# Patient Record
Sex: Female | Born: 1985
Health system: Southern US, Community
[De-identification: ages and names within clinical notes are randomized; demographics above are authoritative.]

## PROBLEM LIST (undated history)

## (undated) DIAGNOSIS — J45909 Unspecified asthma, uncomplicated: Secondary | ICD-10-CM

## (undated) DIAGNOSIS — E559 Vitamin D deficiency, unspecified: Secondary | ICD-10-CM

## (undated) HISTORY — DX: Unspecified asthma, uncomplicated: J45.909

## (undated) HISTORY — DX: Vitamin D deficiency, unspecified: E55.9

## (undated) HISTORY — PX: NO PAST SURGERIES: SHX2092

---
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
## 2004-01-16 ENCOUNTER — Encounter: Admission: RE | Admit: 2004-01-16 | Discharge: 2004-02-25 | Payer: Self-pay | Admitting: Obstetrics & Gynecology

## 2004-11-21 ENCOUNTER — Emergency Department: Payer: Self-pay | Admitting: Unknown Physician Specialty

## 2005-10-12 ENCOUNTER — Ambulatory Visit (HOSPITAL_COMMUNITY): Admission: RE | Admit: 2005-10-12 | Discharge: 2005-10-12 | Payer: Self-pay | Admitting: Obstetrics & Gynecology

## 2008-04-23 ENCOUNTER — Ambulatory Visit (HOSPITAL_COMMUNITY): Admission: RE | Admit: 2008-04-23 | Discharge: 2008-04-23 | Payer: Self-pay | Admitting: Obstetrics & Gynecology

## 2008-07-16 ENCOUNTER — Ambulatory Visit (HOSPITAL_COMMUNITY): Admission: RE | Admit: 2008-07-16 | Discharge: 2008-07-16 | Payer: Self-pay | Admitting: Obstetrics & Gynecology

## 2008-09-18 ENCOUNTER — Inpatient Hospital Stay (HOSPITAL_COMMUNITY): Admission: AD | Admit: 2008-09-18 | Discharge: 2008-09-20 | Payer: Self-pay | Admitting: Obstetrics & Gynecology

## 2009-11-05 ENCOUNTER — Ambulatory Visit (HOSPITAL_COMMUNITY): Admission: RE | Admit: 2009-11-05 | Discharge: 2009-11-05 | Payer: Self-pay | Admitting: Obstetrics

## 2009-12-09 ENCOUNTER — Ambulatory Visit (HOSPITAL_COMMUNITY): Admission: RE | Admit: 2009-12-09 | Discharge: 2009-12-09 | Payer: Self-pay | Admitting: Obstetrics & Gynecology

## 2010-04-22 ENCOUNTER — Inpatient Hospital Stay (HOSPITAL_COMMUNITY)
Admission: AD | Admit: 2010-04-22 | Discharge: 2010-04-23 | Payer: Self-pay | Source: Home / Self Care | Admitting: Obstetrics & Gynecology

## 2010-06-15 ENCOUNTER — Encounter: Payer: Self-pay | Admitting: Obstetrics & Gynecology

## 2010-08-05 LAB — CBC
HCT: 28.6 % — ABNORMAL LOW (ref 36.0–46.0)
Hemoglobin: 9.3 g/dL — ABNORMAL LOW (ref 12.0–15.0)
MCH: 23.6 pg — ABNORMAL LOW (ref 26.0–34.0)
MCH: 25.3 pg — ABNORMAL LOW (ref 26.0–34.0)
MCHC: 32.4 g/dL (ref 30.0–36.0)
MCV: 76.7 fL — ABNORMAL LOW (ref 78.0–100.0)
RBC: 3.93 MIL/uL (ref 3.87–5.11)
RDW: 17.5 % — ABNORMAL HIGH (ref 11.5–15.5)
RDW: 17.6 % — ABNORMAL HIGH (ref 11.5–15.5)
WBC: 11.4 10*3/uL — ABNORMAL HIGH (ref 4.0–10.5)

## 2010-09-03 LAB — CBC
Hemoglobin: 8.8 g/dL — ABNORMAL LOW (ref 12.0–15.0)
MCHC: 34.1 g/dL (ref 30.0–36.0)
MCV: 83.8 fL (ref 78.0–100.0)
Platelets: 192 10*3/uL (ref 150–400)
RBC: 3.8 MIL/uL — ABNORMAL LOW (ref 3.87–5.11)
RDW: 15.2 % (ref 11.5–15.5)
WBC: 11.2 10*3/uL — ABNORMAL HIGH (ref 4.0–10.5)
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
## 2010-10-10 NOTE — Discharge Summary (Signed)
[facility]  Patient:    ERINE, PHENIX                       MRN: [id number] Adm. Date:  [id number] Disc. Date: [id number] Attending:  Milford Cage H                           Discharge Summary  PATIENT IDENTIFICATION:  Purvi was a 25 year old female.  HISTORY OF PRESENT ILLNESS:  Kynlie was admitted to this service by Dr. Marily Lente.  She had been suicidal and depressed.  She stated that she had heard voices telling her to harm herself.  She had become increasingly angry and distraught because she was having to baby-sit for her siblings.  She had begun to experience loss of energy, loss of interest, trouble concentrating, feelings of hopelessness and worthlessness.  She also had trouble sleeping at night.  MENTAL STATUS EXAMINATION:  At the time of the initial evaluation, revealed an alert, oriented young woman who made intermittent eye contact.  Her mood was depressed.  She was positive for suicidal ideation.  There was a history of auditory hallucinations.  There was no other evidence of any psychotic thinking or behavior.  She seemed to be, at least, average intelligence. Concentration was diminished.  Insight was minimal and judgment seemed poor. Other pertinent history can be obtained from the psychosocial service summary. Physical examination was noncontributory.  ADMISSION DIAGNOSES: Axis I:    1. Mood disorder, not otherwise specified.            2. Psychotic disorder, not otherwise specified. Axis II:   Deferred. Axis III:  Healthy. Axis IV:   Severe. Axis V:    15.  FINDINGS:  All indicated laboratory examinations were within normal limits or noncontributory.  HOSPITAL COURSE:  While in the hospital, Shatiqua talked about her issues with her temper.  She admitted that she was short-tempered, and, to some extent, needed to be the center of attention.  She also admitted that she had to be right.  She just could not tolerate it when  someone else disagreed with her. She would keep talking and trying to convince them of her viewpoint.  She also was smart enough to realize that these were her problems and she created her own problems to some extent by the attitude she had, and she seemed perfectly willing to change the attitude.  Her mother came in for a session and they worked things out regarding the rules and expectations of home, and she was discharged.  At the time of discharge, she was denying any threats towards herself or anyone else.  She was excited about going home and believed that things would change for the better.  She also had denied any auditory hallucinations while she was in the hospital.  POST HOSPITAL CARE PLAN:  She was referred to Cheri Fowler, with an appointment for May 4, and Dr. Elesa Massed, with an appointment for June 6, both at the [facility].  At the time of discharge, she was taking Celexa 10 mg at bedtime by mouth, Zyprexa 5 mg at bedtime.  There were no restrictions placed on her activity or her diet.  FINAL DIAGNOSES: Axis I:    Mood disorder, not otherwise specified. Axis II:   No diagnosis. Axis III:  1. Recent history of ulcers.            2.  Allergic rash on the right wrist. Axis IV:   Severe. Axis V:    60. DD:  10/01/99 TD:  10/04/99 Job: [id number]

## 2011-02-09 ENCOUNTER — Other Ambulatory Visit: Payer: Self-pay | Admitting: Internal Medicine

## 2011-02-09 DIAGNOSIS — E049 Nontoxic goiter, unspecified: Secondary | ICD-10-CM

## 2011-02-17 ENCOUNTER — Encounter (HOSPITAL_COMMUNITY)
Admission: RE | Admit: 2011-02-17 | Discharge: 2011-02-17 | Disposition: A | Discharge status: Home / Self Care | Payer: Medicaid Other | Source: Ambulatory Visit | Attending: Internal Medicine | Admitting: Internal Medicine

## 2011-02-17 DIAGNOSIS — E049 Nontoxic goiter, unspecified: Secondary | ICD-10-CM | POA: Insufficient documentation

## 2011-02-18 ENCOUNTER — Ambulatory Visit (HOSPITAL_COMMUNITY)
Admission: RE | Admit: 2011-02-18 | Discharge: 2011-02-18 | Disposition: A | Discharge status: Home / Self Care | Payer: Medicaid Other | Source: Ambulatory Visit | Attending: Internal Medicine | Admitting: Internal Medicine

## 2011-02-18 DIAGNOSIS — E049 Nontoxic goiter, unspecified: Secondary | ICD-10-CM | POA: Insufficient documentation

## 2011-02-18 MED ORDER — SODIUM IODIDE I 131 CAPSULE
9.3000 | Freq: Once | INTRAVENOUS | Status: AC | PRN
  Administered 2011-02-18: 9.3 via ORAL

## 2011-12-14 ENCOUNTER — Ambulatory Visit (HOSPITAL_BASED_OUTPATIENT_CLINIC_OR_DEPARTMENT_OTHER): Payer: Medicaid Other

## 2013-06-26 ENCOUNTER — Encounter: Payer: Self-pay | Admitting: Obstetrics & Gynecology

## 2013-06-26 ENCOUNTER — Ambulatory Visit: Payer: Self-pay | Admitting: Obstetrics & Gynecology

## 2013-06-26 ENCOUNTER — Ambulatory Visit (INDEPENDENT_AMBULATORY_CARE_PROVIDER_SITE_OTHER): Payer: Medicare Other | Admitting: Obstetrics & Gynecology

## 2013-06-26 VITALS — BP 125/84 | HR 112 | Temp 96.9°F | Wt 248.0 lb

## 2013-06-26 DIAGNOSIS — Z3046 Encounter for surveillance of implantable subdermal contraceptive: Secondary | ICD-10-CM

## 2013-06-26 DIAGNOSIS — R634 Abnormal weight loss: Secondary | ICD-10-CM

## 2013-06-26 DIAGNOSIS — Z30017 Encounter for initial prescription of implantable subdermal contraceptive: Secondary | ICD-10-CM

## 2013-06-26 DIAGNOSIS — E669 Obesity, unspecified: Secondary | ICD-10-CM

## 2013-06-26 MED ORDER — PHENTERMINE HCL 37.5 MG PO CAPS: 37.5000 mg | ORAL_CAPSULE | ORAL | Status: DC

## 2013-06-26 NOTE — Progress Notes (Signed)
NEXPLANON REMOVAL/NEXPLANON INSERTION NOTE  Contraception used: *Nexplanon  Pregnancy test result:  No results found for this basename: PREGTESTUR, PREGSERUM, HCG, HCGQUANT    Indications:  The patient desires contraception.  She understands risks, benefits, and alternatives to Implanon and would like to proceed.   Reasons  for removal:  Time for removal   A timeout was performed confirming the patient, the procedure and allergy status. The patient's left  arm was palpated and the implant device located. The area was prepped with Betadinex3. The distal end of the device was palpated and 5 cc of 1% lidocaine was injected. A 5 mm incision was made. Any fibrotic tissue was carefully dissected away using blunt and/or sharp dissection. The device was removed in an intact manner. The protection cap was removed. While placing countertraction on the skin, the needle was inserted at a 30 degree angle.  The applicator was held horizontal to the skin; the skin was tented upward as the needle was introduced into the subdermal space.  While holding the applicator in place, the slider was unlocked. The Nexplanon was removed from the field.  The Nexplanon was palpated to ensure proper placement.  Steri-strips and a sterile dressing were applied to the incision. The patient tolerated the procedure well.  Complications: None  Instructions:  The patient was instructed to remove the dressing in 24 hours and that some bruising is to be expected.  She was advised to use over the counter analgesics as needed for any pain at the site.  She is to keep the area dry for 24 hours and to call if her hand or arm becomes cold, numb, or blue.  The patient requested phentermine for weight loss today.  Counseled ZO:XWRUEAVWUre:lifestyle modifications Return visit:  Return in 6+ weeks

## 2013-06-28 DIAGNOSIS — E669 Obesity, unspecified: Secondary | ICD-10-CM | POA: Insufficient documentation

## 2013-06-28 NOTE — Patient Instructions (Addendum)
Etonogestrel implant What is this medicine? ETONOGESTREL (et oh noe JES trel) is a contraceptive (birth control) device. It is used to prevent pregnancy. It can be used for up to 3 years. This medicine may be used for other purposes; ask your health care provider or pharmacist if you have questions. COMMON BRAND NAME(S): Implanon, Nexplanon  What should I tell my health care provider before I take this medicine? They need to know if you have any of these conditions: -abnormal vaginal bleeding -blood vessel disease or blood clots -cancer of the breast, cervix, or liver -depression -diabetes -gallbladder disease -headaches -heart disease or recent heart attack -high blood pressure -high cholesterol -kidney disease -liver disease -renal disease -seizures -tobacco smoker -an unusual or allergic reaction to etonogestrel, other hormones, anesthetics or antiseptics, medicines, foods, dyes, or preservatives -pregnant or trying to get pregnant -breast-feeding How should I use this medicine? This device is inserted just under the skin on the inner side of your upper arm by a health care professional. Talk to your pediatrician regarding the use of this medicine in children. Special care may be needed. Overdosage: If you think you've taken too much of this medicine contact a poison control center or emergency room at once. Overdosage: If you think you have taken too much of this medicine contact a poison control center or emergency room at once. NOTE: This medicine is only for you. Do not share this medicine with others. What if I miss a dose? This does not apply. What may interact with this medicine? Do not take this medicine with any of the following medications: -amprenavir -bosentan -fosamprenavir This medicine may also interact with the following medications: -barbiturate medicines for inducing sleep or treating seizures -certain medicines for fungal infections like ketoconazole and  itraconazole -griseofulvin -medicines to treat seizures like carbamazepine, felbamate, oxcarbazepine, phenytoin, topiramate -modafinil -phenylbutazone -rifampin -some medicines to treat HIV infection like atazanavir, indinavir, lopinavir, nelfinavir, tipranavir, ritonavir -St. John's wort This list may not describe all possible interactions. Give your health care provider a list of all the medicines, herbs, non-prescription drugs, or dietary supplements you use. Also tell them if you smoke, drink alcohol, or use illegal drugs. Some items may interact with your medicine. What should I watch for while using this medicine? This product does not protect you against HIV infection (AIDS) or other sexually transmitted diseases. You should be able to feel the implant by pressing your fingertips over the skin where it was inserted. Tell your doctor if you cannot feel the implant. What side effects may I notice from receiving this medicine? Side effects that you should report to your doctor or health care professional as soon as possible: -allergic reactions like skin rash, itching or hives, swelling of the face, lips, or tongue -breast lumps -changes in vision -confusion, trouble speaking or understanding -dark urine -depressed mood -general ill feeling or flu-like symptoms -light-colored stools -loss of appetite, nausea -right upper belly pain -severe headaches -severe pain, swelling, or tenderness in the abdomen -shortness of breath, chest pain, swelling in a leg -signs of pregnancy -sudden numbness or weakness of the face, arm or leg -trouble walking, dizziness, loss of balance or coordination -unusual vaginal bleeding, discharge -unusually weak or tired -yellowing of the eyes or skin Side effects that usually do not require medical attention (Report these to your doctor or health care professional if they continue or are bothersome.): -acne -breast pain -changes in  weight -cough -fever or chills -headache -irregular menstrual bleeding -itching, burning,   and vaginal discharge -pain or difficulty passing urine -sore throat This list may not describe all possible side effects. Call your doctor for medical advice about side effects. You may report side effects to FDA at 1-800-FDA-1088. Where should I keep my medicine? This drug is given in a hospital or clinic and will not be stored at home. NOTE: This sheet is a summary. It may not cover all possible information. If you have questions about this medicine, talk to your doctor, pharmacist, or health care provider.  2014, Elsevier/Gold Standard. (2011-11-16 15:37:45) Exercise to Lose Weight Exercise and a healthy diet may help you lose weight. Your doctor may suggest specific exercises. EXERCISE IDEAS AND TIPS  Choose low-cost things you enjoy doing, such as walking, bicycling, or exercising to workout videos.  Take stairs instead of the elevator.  Walk during your lunch break.  Park your car further away from work or school.  Go to a gym or an exercise class.  Start with 5 to 10 minutes of exercise each day. Build up to 30 minutes of exercise 4 to 6 days a week.  Wear shoes with good support and comfortable clothes.  Stretch before and after working out.  Work out until you breathe harder and your heart beats faster.  Drink extra water when you exercise.  Do not do so much that you hurt yourself, feel dizzy, or get very short of breath. Exercises that burn about 150 calories:  Running 1  miles in 15 minutes.  Playing volleyball for 45 to 60 minutes.  Washing and waxing a car for 45 to 60 minutes.  Playing touch football for 45 minutes.  Walking 1  miles in 35 minutes.  Pushing a stroller 1  miles in 30 minutes.  Playing basketball for 30 minutes.  Raking leaves for 30 minutes.  Bicycling 5 miles in 30 minutes.  Walking 2 miles in 30 minutes.  Dancing for 30  minutes.  Shoveling snow for 15 minutes.  Swimming laps for 20 minutes.  Walking up stairs for 15 minutes.  Bicycling 4 miles in 15 minutes.  Gardening for 30 to 45 minutes.  Jumping rope for 15 minutes.  Washing windows or floors for 45 to 60 minutes. Document Released: 06/13/2010 Document Revised: 08/03/2011 Document Reviewed: 06/13/2010 [facility] Patient Information 2014 [place], [place]. Calorie Counting Diet A calorie counting diet requires you to eat the number of calories that are right for you in a day. Calories are the measurement of how much energy you get from the food you eat. Eating the right amount of calories is important for staying at a healthy weight. If you eat too many calories, your body will store them as fat and you may gain weight. If you eat too few calories, you may lose weight. Counting the number of calories you eat during a day will help you know if you are eating the right amount. A Registered Dietitian can determine how many calories you need in a day. The amount of calories needed varies from person to person. If your goal is to lose weight, you will need to eat fewer calories. Losing weight can benefit you if you are overweight or have health problems such as heart disease, high blood pressure, or diabetes. If your goal is to gain weight, you will need to eat more calories. Gaining weight may be necessary if you have a certain health problem that causes your body to need more energy. TIPS Whether you are increasing or decreasing the number  of calories you eat during a day, it may be hard to get used to changes in what you eat and drink. The following are tips to help you keep track of the number of calories you eat.  Measure foods at home with measuring cups. This helps you know the amount of food and number of calories you are eating.  Restaurants often serve food in amounts that are larger than 1 serving. While eating out, estimate how many servings of a food  you are given. For example, a serving of cooked rice is  cup or about the size of half of a fist. Knowing serving sizes will help you be aware of how much food you are eating at restaurants.  Ask for smaller portion sizes or child-size portions at restaurants.  Plan to eat half of a meal at a restaurant. Take the rest home or share the other half with a friend.  Read the Nutrition Facts panel on food labels for calorie content and serving size. You can find out how many servings are in a package, the size of a serving, and the number of calories each serving has.  For example, a package might contain 3 cookies. The Nutrition Facts panel on that package says that 1 serving is 1 cookie. Below that, it will say there are 3 servings in the container. The calories section of the Nutrition Facts label says there are 90 calories. This means there are 90 calories in 1 cookie (1 serving). If you eat 1 cookie you have eaten 90 calories. If you eat all 3 cookies, you have eaten 270 calories (3 servings x 90 calories = 270 calories). The list below tells you how big or small some common portion sizes are.  1 oz.........4 stacked dice.  3 oz........Marland KitchenDeck of cards.  1 tsp.......Marland KitchenTip of little finger.  1 tbs......Marland KitchenMarland KitchenThumb.  2 tbs.......Marland KitchenGolf ball.   cup......Marland KitchenHalf of a fist.  1 cup.......Marland KitchenA fist. KEEP A FOOD LOG Write down every food item you eat, the amount you eat, and the number of calories in each food you eat during the day. At the end of the day, you can add up the total number of calories you have eaten. It may help to keep a list like the one below. Find out the calorie information by reading the Nutrition Facts panel on food labels. Breakfast  Bran cereal (1 cup, 110 calories).  Fat-free milk ( cup, 45 calories). Snack  Apple (1 medium, 80 calories). Lunch  Spinach (1 cup, 20 calories).  Tomato ( medium, 20 calories).  Chicken breast strips (3 oz, 165 calories).  Shredded  cheddar cheese ( cup, 110 calories).  Light [place] dressing (2 tbs, 60 calories).  Whole-wheat bread (1 slice, 80 calories).  Tub margarine (1 tsp, 35 calories).  Vegetable soup (1 cup, 160 calories). Dinner  Pork chop (3 oz, 190 calories).  Brown rice (1 cup, 215 calories).  Steamed broccoli ( cup, 20 calories).  Strawberries (1  cup, 65 calories).  Whipped cream (1 tbs, 50 calories). Daily Calorie Total: 1425 Document Released: 05/11/2005 Document Revised: 08/03/2011 Document Reviewed: 11/05/2006 [facility] Patient Information 2014 [place], [place].

## 2013-06-30 ENCOUNTER — Ambulatory Visit (INDEPENDENT_AMBULATORY_CARE_PROVIDER_SITE_OTHER): Payer: Medicare Other | Admitting: Obstetrics & Gynecology

## 2013-06-30 ENCOUNTER — Encounter: Payer: Self-pay | Admitting: Obstetrics & Gynecology

## 2013-06-30 VITALS — BP 120/86 | HR 116 | Temp 98.4°F | Ht 64.0 in | Wt 241.0 lb

## 2013-06-30 DIAGNOSIS — Z3046 Encounter for surveillance of implantable subdermal contraceptive: Secondary | ICD-10-CM

## 2013-06-30 LAB — CK: CK TOTAL: 278 U/L — AB (ref 7–177)

## 2013-06-30 LAB — HEMOGLOBIN: Hemoglobin: 14 g/dL (ref 12.0–15.0)

## 2013-06-30 LAB — HEMATOCRIT: HEMATOCRIT: 40.5 % (ref 36.0–46.0)

## 2013-06-30 NOTE — Progress Notes (Signed)
Subjective:     Michelle Freeman is a 28 y.o. female here for a routine exam.  Current complaints: Patient in office today for nexplanon complications. Patient states insertion site is very bruised. Patient states she is having pain all the way down her arm. Patient states it is like a pushing pain on her veins. Patient states she feels like she is going to pass out.  Personal health questionnaire reviewed: yes.   Gynecologic History No LMP recorded. Patient has had an implant. Contraception: Nexplanon  Obstetric History OB History  Gravida Para Term Preterm AB SAB TAB Ectopic Multiple Living  4 2 2  2     2     # Outcome Date GA Lbr Len/2nd Weight Sex Delivery Anes PTL Lv  4 TRM 04/22/10 3721w0d  6 lb 2 oz (2.778 kg)     Y  3 ABT 02/2009        N  2 TRM 09/19/08 1121w0d  6 lb 12 oz (3.062 kg) M    Y  1 ABT 10/2000        N       The following portions of the patient's history were reviewed and updated as appropriate: allergies, current medications, past family history, past medical history, past social history, past surgical history and problem list.  Review of Systems Pertinent items are noted in HPI.    Objective:     LUE: ecchymotic area; no palpable mass; implant palpated     Assessment:    Bruising at Nexplanon insertion site  Plan:    Supportive measures  Return in 1 week

## 2013-07-01 LAB — TSH: TSH: 2.013 u[IU]/mL (ref 0.350–4.500)

## 2013-07-01 LAB — T4, FREE: FREE T4: 1.45 ng/dL (ref 0.80–1.80)

## 2013-07-05 ENCOUNTER — Ambulatory Visit: Payer: Medicaid Other | Admitting: Obstetrics & Gynecology

## 2013-08-16 ENCOUNTER — Ambulatory Visit: Payer: Medicaid Other | Admitting: Obstetrics & Gynecology

## 2013-08-17 ENCOUNTER — Encounter: Payer: Self-pay | Admitting: Obstetrics & Gynecology

## 2013-08-17 ENCOUNTER — Ambulatory Visit: Payer: Medicaid Other | Admitting: Obstetrics & Gynecology

## 2013-08-17 ENCOUNTER — Ambulatory Visit (INDEPENDENT_AMBULATORY_CARE_PROVIDER_SITE_OTHER): Payer: Medicare Other | Admitting: Obstetrics & Gynecology

## 2013-08-17 VITALS — BP 117/82 | HR 106 | Temp 97.6°F | Wt 238.0 lb

## 2013-08-17 DIAGNOSIS — Z0000 Encounter for general adult medical examination without abnormal findings: Secondary | ICD-10-CM

## 2013-08-17 DIAGNOSIS — D649 Anemia, unspecified: Secondary | ICD-10-CM

## 2013-08-17 DIAGNOSIS — Z01419 Encounter for gynecological examination (general) (routine) without abnormal findings: Secondary | ICD-10-CM

## 2013-08-17 DIAGNOSIS — Z124 Encounter for screening for malignant neoplasm of cervix: Secondary | ICD-10-CM

## 2013-08-17 DIAGNOSIS — Z113 Encounter for screening for infections with a predominantly sexual mode of transmission: Secondary | ICD-10-CM

## 2013-08-17 DIAGNOSIS — D509 Iron deficiency anemia, unspecified: Secondary | ICD-10-CM

## 2013-08-17 DIAGNOSIS — Z131 Encounter for screening for diabetes mellitus: Secondary | ICD-10-CM

## 2013-08-17 NOTE — Progress Notes (Signed)
Subjective:     Michelle Freeman is a 28 y.o. female here for a routine exam.  Current complaints: annual exam.  Patient states she is having pain and tenderness in her breast. Pt states her breasts now have black spots. Pt states she is having a burning pain in her stomach. Patient states she is having pain in her back.   Personal health questionnaire reviewed: yes.   Gynecologic History No LMP recorded. Patient has had an implant. Contraception: Nexplanon Last Pap: 10/2009. Results were: normal  Obstetric History OB History  Gravida Para Term Preterm AB SAB TAB Ectopic Multiple Living  4 2 2  2     2     # Outcome Date GA Lbr Len/2nd Weight Sex Delivery Anes PTL Lv  4 TRM 04/22/10 3737w0d  6 lb 2 oz (2.778 kg)     Y  3 ABT 02/2009        N  2 TRM 09/19/08 8137w0d  6 lb 12 oz (3.062 kg) M    Y  1 ABT 10/2000        N       The following portions of the patient's history were reviewed and updated as appropriate: allergies, current medications, past family history, past medical history, past social history, past surgical history and problem list.  Review of Systems Pertinent items are noted in HPI.    Objective:      General appearance: alert Breasts: normal appearance, no masses or tenderness Abdomen: soft, non-tender; bowel sounds normal; no masses,  no organomegaly Pelvic: cervix normal in appearance, external genitalia normal, no adnexal masses or tenderness, uterus normal size, shape, and consistency and vagina normal without discharge      Ext:  LUE--implant palpable  Assessment:   Breast pain--large breasts Obesity on pharmacologic treatment w/weight loss documented today Plan:    Counseled ZO:[id number]:supportive management of breast pain Continue Phentermine--monthly weight/vital sign check

## 2013-08-18 LAB — PAP IG, CT-NG, RFX HPV ASCU
CHLAMYDIA PROBE AMP: NEGATIVE
GC PROBE AMP: NEGATIVE

## 2013-08-18 LAB — HEMOGLOBIN A1C
Hgb A1c MFr Bld: 5.8 % — ABNORMAL HIGH (ref <5.7)
MEAN PLASMA GLUCOSE: 120 mg/dL — AB (ref <117)

## 2013-08-18 LAB — HIV ANTIBODY (ROUTINE TESTING W REFLEX): HIV: NONREACTIVE

## 2013-08-18 LAB — HEMOGLOBIN: Hemoglobin: 14.2 g/dL (ref 12.0–15.0)

## 2013-08-18 LAB — RPR

## 2013-08-18 LAB — HEMATOCRIT: HCT: 41.8 % (ref 36.0–46.0)

## 2013-08-20 DIAGNOSIS — Z01419 Encounter for gynecological examination (general) (routine) without abnormal findings: Secondary | ICD-10-CM | POA: Insufficient documentation

## 2013-08-20 NOTE — Patient Instructions (Signed)

## 2013-09-11 ENCOUNTER — Ambulatory Visit (INDEPENDENT_AMBULATORY_CARE_PROVIDER_SITE_OTHER): Payer: Medicare Other | Admitting: *Deleted

## 2013-09-11 VITALS — BP 115/76 | HR 120 | Temp 98.4°F | Ht 64.0 in | Wt 240.0 lb

## 2013-09-11 DIAGNOSIS — E669 Obesity, unspecified: Secondary | ICD-10-CM

## 2013-09-11 NOTE — Progress Notes (Addendum)
Patient states she has had a burning sensation in her chest lately with her bra on. Patient states she has pain in her chest first thing in the morning and gets short of breath easily.  Patient states she will take a deep breath and cant exhale to inhale again. Patient states when she finally exhale she feels like she is strangled. Patient states the breathing is only occasionally but that the chest pain has been happening everyday for the past week. Patient states she has started using her Albuterol Inhaler again but that it hasn't really helped. Patient states she does not take blood pressure medications. Blood Pressure was 115/76 Today. Patient advised about diet and exercise, handouts given. Patient notified of lab results and advised to follow up with her primary care physician regarding lab results, Chest pain and symptoms. Copy of lab results given to patient.

## 2013-12-11 ENCOUNTER — Ambulatory Visit: Payer: Medicare Other | Admitting: *Deleted

## 2013-12-11 VITALS — BP 121/81 | HR 105 | Ht 65.0 in | Wt 240.0 lb

## 2013-12-11 DIAGNOSIS — R634 Abnormal weight loss: Secondary | ICD-10-CM

## 2013-12-11 MED ORDER — PHENTERMINE HCL 37.5 MG PO CAPS: 37.5000 mg | ORAL_CAPSULE | ORAL | Status: DC

## 2013-12-12 NOTE — Progress Notes (Signed)
Patient had multiple complaints and they were briefly addressed by the provider. Patient is to seek a new primary care provider and work on her activity level- getting out of her house and doing more for herself. Patient to follow up  In a month with progress.

## 2014-02-15 ENCOUNTER — Ambulatory Visit: Payer: Medicare Other | Admitting: Obstetrics & Gynecology

## 2014-03-09 ENCOUNTER — Other Ambulatory Visit: Payer: Self-pay

## 2014-05-21 ENCOUNTER — Encounter: Payer: Self-pay | Admitting: *Deleted

## 2014-05-22 ENCOUNTER — Encounter: Payer: Self-pay | Admitting: Obstetrics & Gynecology

## 2014-08-17 ENCOUNTER — Emergency Department: Payer: Self-pay | Admitting: Emergency Medicine

## 2014-08-17 LAB — BASIC METABOLIC PANEL
Anion Gap: 8 (ref 7–16)
BUN: 8 mg/dL
CALCIUM: 9.2 mg/dL
CHLORIDE: 108 mmol/L
Co2: 23 mmol/L
Creatinine: 0.65 mg/dL
EGFR (African American): >60
Glucose: 110 mg/dL — ABNORMAL HIGH
POTASSIUM: 3.8 mmol/L
Sodium: 139 mmol/L

## 2014-08-17 LAB — CBC
HCT: 41 % (ref 35.0–47.0)
HGB: 13.6 g/dL (ref 12.0–16.0)
MCH: 28.2 pg (ref 26.0–34.0)
MCHC: 33.1 g/dL (ref 32.0–36.0)
MCV: 85 fL (ref 80–100)
PLATELETS: 217 10*3/uL (ref 150–440)
RBC: 4.8 10*6/uL (ref 3.80–5.20)
RDW: 13.2 % (ref 11.5–14.5)
WBC: 11.2 10*3/uL — AB (ref 3.6–11.0)

## 2014-08-17 LAB — TROPONIN I: Troponin-I: <0.03 ng/mL

## 2014-08-18 LAB — PREGNANCY, URINE: Pregnancy Test, Urine: NEGATIVE m[IU]/mL

## 2015-01-02 ENCOUNTER — Ambulatory Visit (HOSPITAL_BASED_OUTPATIENT_CLINIC_OR_DEPARTMENT_OTHER): Payer: Medicare Other | Attending: Internal Medicine | Admitting: *Deleted

## 2015-01-02 VITALS — Ht 64.0 in | Wt 241.0 lb

## 2015-01-02 DIAGNOSIS — G478 Other sleep disorders: Secondary | ICD-10-CM | POA: Insufficient documentation

## 2015-01-02 DIAGNOSIS — G4750 Parasomnia, unspecified: Secondary | ICD-10-CM | POA: Insufficient documentation

## 2015-01-02 DIAGNOSIS — F513 Sleepwalking [somnambulism]: Secondary | ICD-10-CM | POA: Diagnosis not present

## 2015-01-02 DIAGNOSIS — R5383 Other fatigue: Secondary | ICD-10-CM | POA: Insufficient documentation

## 2015-01-02 DIAGNOSIS — R0683 Snoring: Secondary | ICD-10-CM | POA: Insufficient documentation

## 2015-01-02 DIAGNOSIS — Z8669 Personal history of other diseases of the nervous system and sense organs: Secondary | ICD-10-CM

## 2015-01-02 DIAGNOSIS — R51 Headache: Secondary | ICD-10-CM | POA: Insufficient documentation

## 2015-01-05 ENCOUNTER — Ambulatory Visit (HOSPITAL_BASED_OUTPATIENT_CLINIC_OR_DEPARTMENT_OTHER): Payer: Medicaid Other | Admitting: Internal Medicine

## 2015-01-05 DIAGNOSIS — Z8669 Personal history of other diseases of the nervous system and sense organs: Secondary | ICD-10-CM

## 2015-01-05 DIAGNOSIS — R0683 Snoring: Secondary | ICD-10-CM | POA: Diagnosis not present

## 2015-01-05 NOTE — Progress Notes (Signed)
   Patient Name: Michelle Freeman, Hamil Date: 01/02/2015 Gender: Female D.O.B: November 26, 1985 Age (years): 29 Referring Provider: Fleet Contras Height (inches): 64 Interpreting Physician: Jetty Duhamel MD, ABSM Weight (lbs): 241 RPSGT: Elaina Pattee BMI: 41 MRN: [id number] Neck Size: 15.50 CLINICAL INFORMATION Sleep Study Type: NPSG  Indication for sleep study: Fatigue, Morning Headaches, Sleep walking/talking/parasomnias  Epworth Sleepiness Score: 17  SLEEP STUDY TECHNIQUE As per the AASM Manual for the Scoring of Sleep and Associated Events v2.3 (April 2016) with a hypopnea requiring 4% desaturations.  The channels recorded and monitored were frontal, central and occipital EEG, electrooculogram (EOG), submentalis EMG (chin), nasal and oral airflow, thoracic and abdominal wall motion, anterior tibialis EMG, snore microphone, electrocardiogram, and pulse oximetry.  MEDICATIONS Patient's medications include: Charted for review. Medications self-administered by patient during sleep study : No sleep medicine administered.  SLEEP ARCHITECTURE The study was initiated at 11:44:20 PM and ended at 5:52:23 AM.  Sleep onset time was 36.4 minutes and the sleep efficiency was 87.4%. The total sleep time was 321.6 minutes.  Stage REM latency was 66.0 minutes.  The patient spent 2.02% of the night in stage N1 sleep, 69.84% in stage N2 sleep, 10.26% in stage N3 and 17.88% in REM.  Alpha intrusion was absent.  Supine sleep was 0.00%.  Awake after sleep onset 10 minutes  RESPIRATORY PARAMETERS The overall apnea/hypopnea index (AHI) was 0.0 per hour. There were 0 total apneas, including 0 obstructive, 0 central and 0 mixed apneas. There were 0 hypopneas and 0 RERAs.  The AHI during Stage REM sleep was 0.0 per hour.  AHI while supine was N/A per hour.  The mean oxygen saturation was 95.70%. The minimum SpO2 during sleep was 92.00%.  Moderate snoring was noted during this  study.  CARDIAC DATA The 2 lead EKG demonstrated sinus rhythm. The mean heart rate was 83.89 beats per minute. Other EKG findings include: None.  LEG MOVEMENT DATA The total PLMS were 0 with a resulting PLMS index of 0.00. Associated arousal with leg movement index was 0.0 .  IMPRESSIONS No significant obstructive sleep apnea occurred during this study (AHI = 0.0/h). No significant central sleep apnea occurred during this study (CAI = 0.0/h). The patient had minimal or no oxygen desaturation during the study (Min O2 = 92.00%) The patient snored with Moderate snoring volume. No cardiac abnormalities were noted during this study. Clinically significant periodic limb movements did not occur during sleep. No significant associated arousals.   DIAGNOSIS Primary Snoring (786.09 [R06.83 ICD-10])   RECOMMENDATIONS Avoid alcohol, sedatives and other CNS depressants that may worsen sleep apnea and disrupt normal sleep architecture. Sleep hygiene should be reviewed to assess factors that may improve sleep quality. Weight management and regular exercise should be initiated or continued if appropriate.  Waymon Budge Diplomate, American Board of Sleep Medicine  ELECTRONICALLY SIGNED ON:  01/05/2015, 11:02 AM [facility] PH: [phone]   FX: [phone] ACCREDITED BY THE AMERICAN ACADEMY OF SLEEP MEDICINE

## 2015-07-16 ENCOUNTER — Encounter: Payer: Self-pay | Admitting: Obstetrics

## 2015-07-16 ENCOUNTER — Ambulatory Visit (INDEPENDENT_AMBULATORY_CARE_PROVIDER_SITE_OTHER): Payer: Medicare Other | Admitting: Obstetrics

## 2015-07-16 VITALS — BP 134/89 | HR 100 | Temp 98.0°F | Wt 248.0 lb

## 2015-07-16 DIAGNOSIS — Z124 Encounter for screening for malignant neoplasm of cervix: Secondary | ICD-10-CM | POA: Diagnosis not present

## 2015-07-16 DIAGNOSIS — E669 Obesity, unspecified: Secondary | ICD-10-CM

## 2015-07-16 DIAGNOSIS — R102 Pelvic and perineal pain: Secondary | ICD-10-CM | POA: Diagnosis not present

## 2015-07-16 DIAGNOSIS — Z01419 Encounter for gynecological examination (general) (routine) without abnormal findings: Secondary | ICD-10-CM

## 2015-07-17 ENCOUNTER — Encounter: Payer: Self-pay | Admitting: Obstetrics

## 2015-07-17 NOTE — Progress Notes (Signed)
Subjective:        Michelle Freeman is a 30 y.o. female here for a routine exam.  Current complaints: Painful intercourse.    Personal health questionnaire:  Is patient Ashkenazi Jewish, have a family history of breast and/or ovarian cancer: no Is there a family history of uterine cancer diagnosed at age < 60, gastrointestinal cancer, urinary tract cancer, family member who is a Personnel officer syndrome-associated carrier: no Is the patient overweight and hypertensive, family history of diabetes, personal history of gestational diabetes, preeclampsia or PCOS: no Is patient over 39, have PCOS,  family history of premature CHD under age 85, diabetes, smoke, have hypertension or peripheral artery disease:  no At any time, has a partner hit, kicked or otherwise hurt or frightened you?: no Over the past 2 weeks, have you felt down, depressed or hopeless?: no Over the past 2 weeks, have you felt little interest or pleasure in doing things?:no   Gynecologic History No LMP recorded (lmp unknown). Contraception: Nexplanon Last Pap: 2015. Results were: normal Last mammogram: n/a. Results were: n/a  Obstetric History OB History  Gravida Para Term Preterm AB SAB TAB Ectopic Multiple Living  # Outcome Date GA Lbr Len/2nd Weight Sex Delivery Anes PTL Lv  4 Term 04/22/10 [redacted]w[redacted]d  6 lb 2 oz (2.778 kg)     Y  3 AB 02/2009        N  2 Term 09/19/08 [redacted]w[redacted]d  6 lb 12 oz (3.062 kg) M    Y  1 AB 10/2000        N      Past Medical History  Diagnosis Date  . Vitamin D deficiency     History reviewed. No pertinent past surgical history.   Current outpatient prescriptions:  .  etonogestrel (NEXPLANON) 68 MG IMPL implant, Inject 1 each into the skin once., Disp: , Rfl:  .  Multiple Vitamins-Minerals (BODY/HAIR/SKIN/NAILS PO), Take by mouth., Disp: , Rfl:  .  phentermine 37.5 MG capsule, Take 1 capsule (37.5 mg total) by mouth every morning., Disp: 30 capsule, Rfl: 2 .  traZODone (DESYREL)  50 MG tablet, Take 50 mg by mouth at bedtime., Disp: , Rfl:  .  Vitamin D, Ergocalciferol, (DRISDOL) 50000 units CAPS capsule, Take 50,000 Units by mouth every 7 (seven) days., Disp: , Rfl:  No Known Allergies  Social History  Substance Use Topics  . Smoking status: Former Smoker -- 0.50 packs/day    Types: Cigarettes  . Smokeless tobacco: Former Neurosurgeon    Quit date: 05/25/2015  . Alcohol Use: 0.0 oz/week    0 Standard drinks or equivalent per week     Comment: occasionally     Family History  Problem Relation Age of Onset  . Diabetes Mother   . Obesity Mother   . Mental illness Mother       Review of Systems  Constitutional: negative for fatigue and weight loss Respiratory: negative for cough and wheezing Cardiovascular: negative for chest pain, fatigue and palpitations Gastrointestinal: negative for abdominal pain and change in bowel habits Musculoskeletal:negative for myalgias Neurological: negative for gait problems and tremors Behavioral/Psych: negative for abusive relationship, depression Endocrine: negative for temperature intolerance   Genitourinary: positive for pain with intercourse Integument/breast: negative for breast lump, breast tenderness, nipple discharge and skin lesion(s)    Objective:       BP 134/89 mmHg  Pulse 100  Temp(Src) 98 F (  36.7 C)  Wt 248 lb (112.492 kg)  LMP  (LMP Unknown) General:   alert  Skin:   no rash or abnormalities  Lungs:   clear to auscultation bilaterally  Heart:   regular rate and rhythm, S1, S2 normal, no murmur, click, rub or gallop  Breasts:   normal without suspicious masses, skin or nipple changes or axillary nodes  Abdomen:  normal findings: no organomegaly, soft, non-tender and no hernia  Pelvis:  External genitalia: normal general appearance Urinary system: urethral meatus normal and bladder without fullness, nontender Vaginal: normal without tenderness, induration or masses Cervix: normal appearance Adnexa: LLQ  tenderness.  No masses. Uterus: anteverted and non-tender, normal size   Lab Review Urine pregnancy test Labs reviewed yes Radiologic studies reviewed yes    Assessment:    Healthy female exam.    Contraceptive management   Plan:    Education reviewed: low fat, low cholesterol diet, safe sex/STD prevention, self breast exams and weight bearing exercise. Contraception: Nexplanon. Follow up in: 1 year.   Ultrasound ordered  Meds ordered this encounter  Medications  . Vitamin D, Ergocalciferol, (DRISDOL) 50000 units CAPS capsule    Sig: Take 50,000 Units by mouth every 7 (seven) days.  . traZODone (DESYREL) 50 MG tablet    Sig: Take 50 mg by mouth at bedtime.  . Multiple Vitamins-Minerals (BODY/HAIR/SKIN/NAILS PO)    Sig: Take by mouth.   Orders Placed This Encounter  Procedures  . SureSwab, Vaginosis/Vaginitis Plus  . US Transvaginal Non-OB    Standing Status: Future     Number of Occurrences:      Standing Expiration Date: 09/12/2016    Order Specific Question:  Reason for Exam (SYMPTOM  OR DIAGNOSIS REQUIRED)    Answer:  Pelvic pain    Order Specific Question:  Preferred imaging location?    Answer:  Internal  . US Pelvis Complete    Standing Status: Future     Number of Occurrences:      Standing Expiration Date: 09/12/2016    Order Specific Question:  Reason for Exam (SYMPTOM  OR DIAGNOSIS REQUIRED)    Answer:  Pelvic pain    Order Specific Question:  Preferred imaging location?    Answer:  Internal

## 2015-07-18 ENCOUNTER — Ambulatory Visit (INDEPENDENT_AMBULATORY_CARE_PROVIDER_SITE_OTHER): Payer: Medicare Other

## 2015-07-18 DIAGNOSIS — R102 Pelvic and perineal pain: Secondary | ICD-10-CM

## 2015-07-18 LAB — PAP, TP IMAGING W/ HPV RNA, RFLX HPV TYPE 16,18/45: HPV mRNA, High Risk: NOT DETECTED

## 2015-07-20 LAB — SURESWAB, VAGINOSIS/VAGINITIS PLUS
Atopobium vaginae: NOT DETECTED Log (cells/mL)
C. ALBICANS, DNA: NOT DETECTED
C. GLABRATA, DNA: NOT DETECTED
C. TRACHOMATIS RNA, TMA: NOT DETECTED
C. parapsilosis, DNA: NOT DETECTED
C. tropicalis, DNA: NOT DETECTED
GARDNERELLA VAGINALIS: NOT DETECTED Log (cells/mL)
LACTOBACILLUS SPECIES: 7.6 Log (cells/mL)
MEGASPHAERA SPECIES: NOT DETECTED Log (cells/mL)
N. gonorrhoeae RNA, TMA: NOT DETECTED
T. VAGINALIS RNA, QL TMA: NOT DETECTED

## 2015-07-25 ENCOUNTER — Other Ambulatory Visit: Payer: Medicare Other

## 2015-07-31 ENCOUNTER — Ambulatory Visit: Payer: Medicare Other | Admitting: Obstetrics

## 2016-07-16 ENCOUNTER — Encounter: Payer: Self-pay | Admitting: Obstetrics

## 2016-07-16 ENCOUNTER — Ambulatory Visit (INDEPENDENT_AMBULATORY_CARE_PROVIDER_SITE_OTHER): Payer: Medicare Other | Admitting: Obstetrics

## 2016-07-16 ENCOUNTER — Encounter: Payer: Self-pay | Admitting: *Deleted

## 2016-07-16 VITALS — BP 105/73 | HR 80 | Wt 205.0 lb

## 2016-07-16 DIAGNOSIS — Z3046 Encounter for surveillance of implantable subdermal contraceptive: Secondary | ICD-10-CM

## 2016-07-16 NOTE — Progress Notes (Signed)

## 2016-07-16 NOTE — Progress Notes (Signed)
Patient is in the office to have her Nexplanon removed- she states it is past time. Patient is not sexually active at this time.

## 2016-08-04 ENCOUNTER — Ambulatory Visit: Payer: Self-pay | Admitting: Obstetrics

## 2016-08-07 IMAGING — CR DG CHEST 2V
1 series · 2 of 2 positions shown · non-contrast
Comparison: None.

CLINICAL DATA: Shortness of Breath, cough, chest pain

EXAM:
CHEST  2 VIEW

[Series 1: dxr chest pa (or ap) and lateral · 0.14mm/px · 2 of 2 slices shown]
[im 1/2]
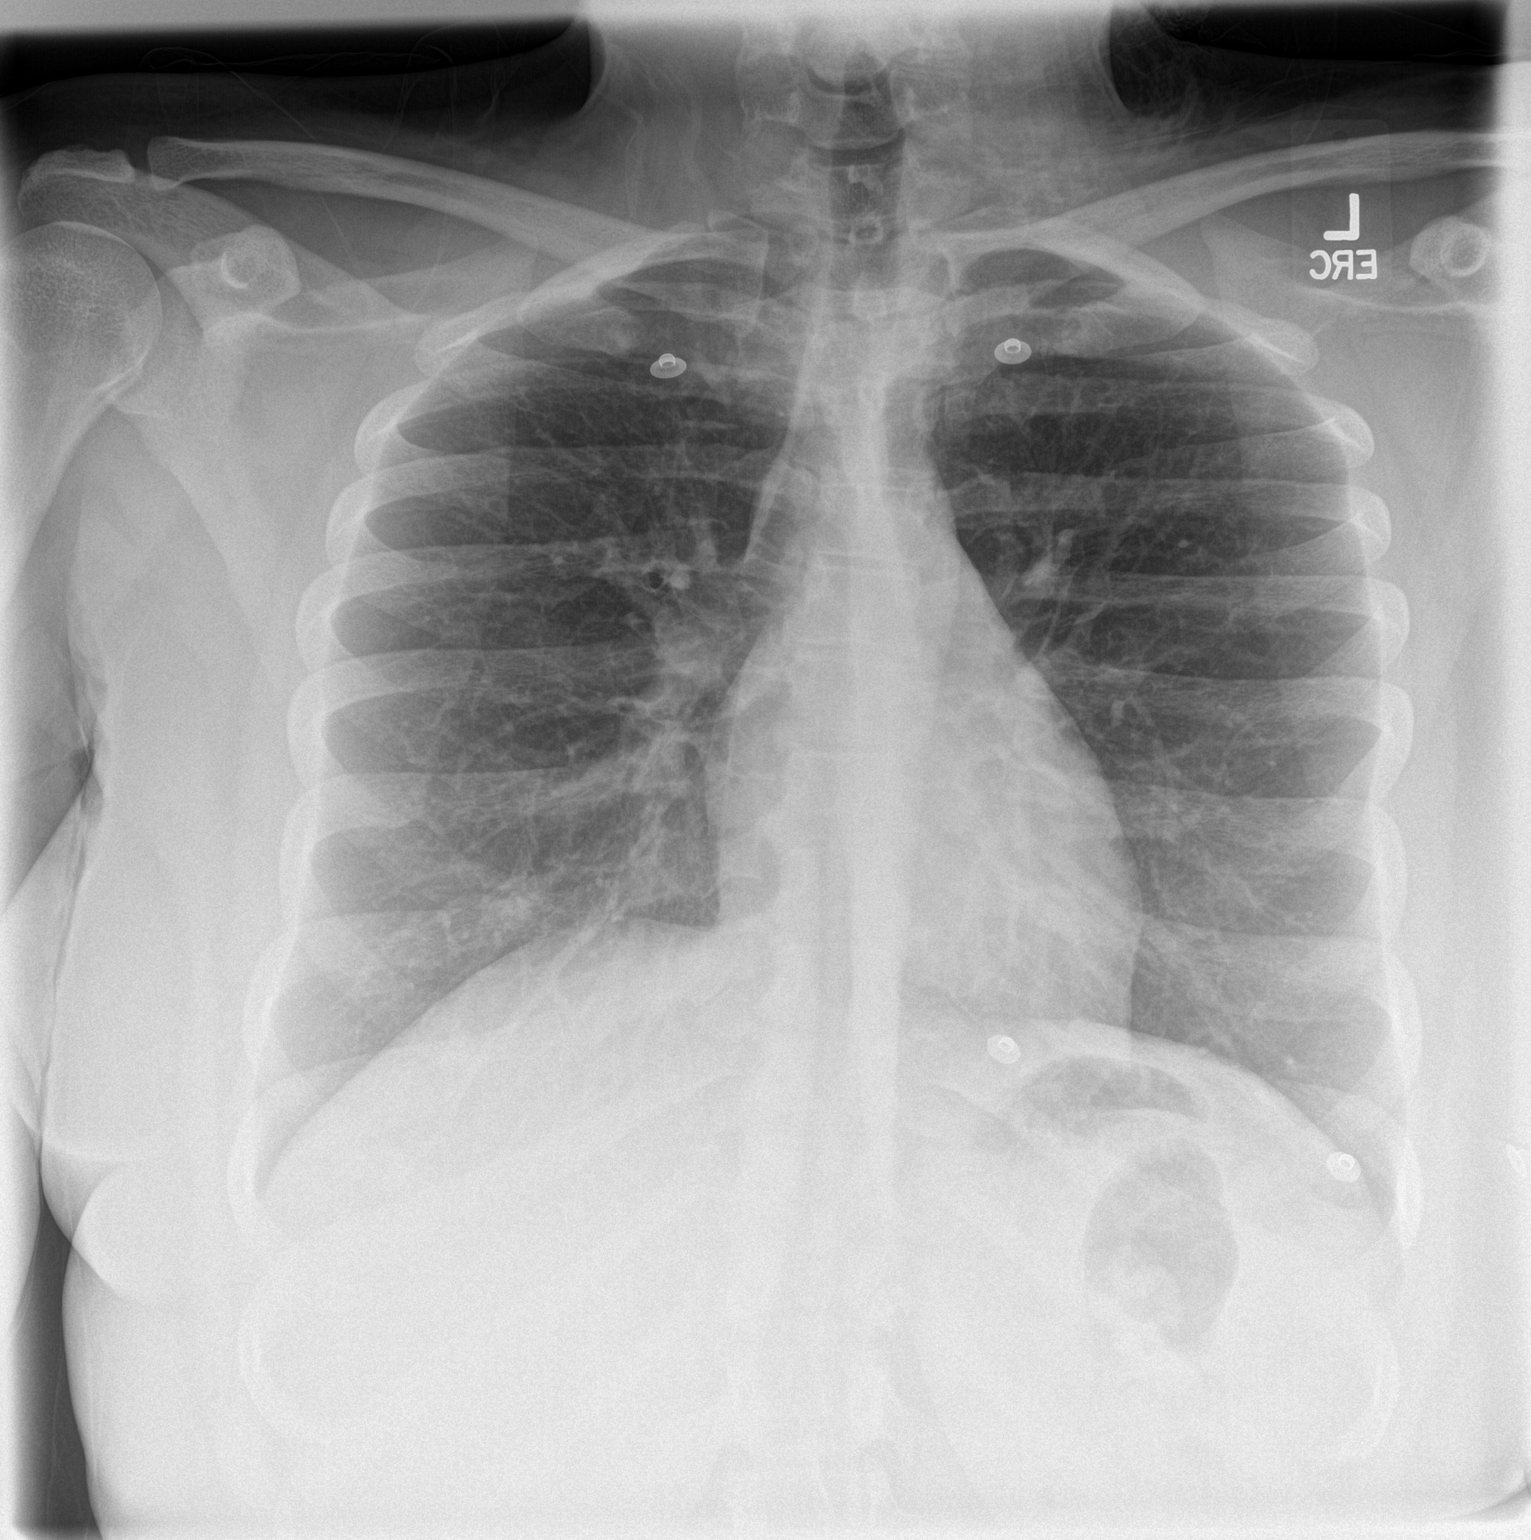
[im 2/2]
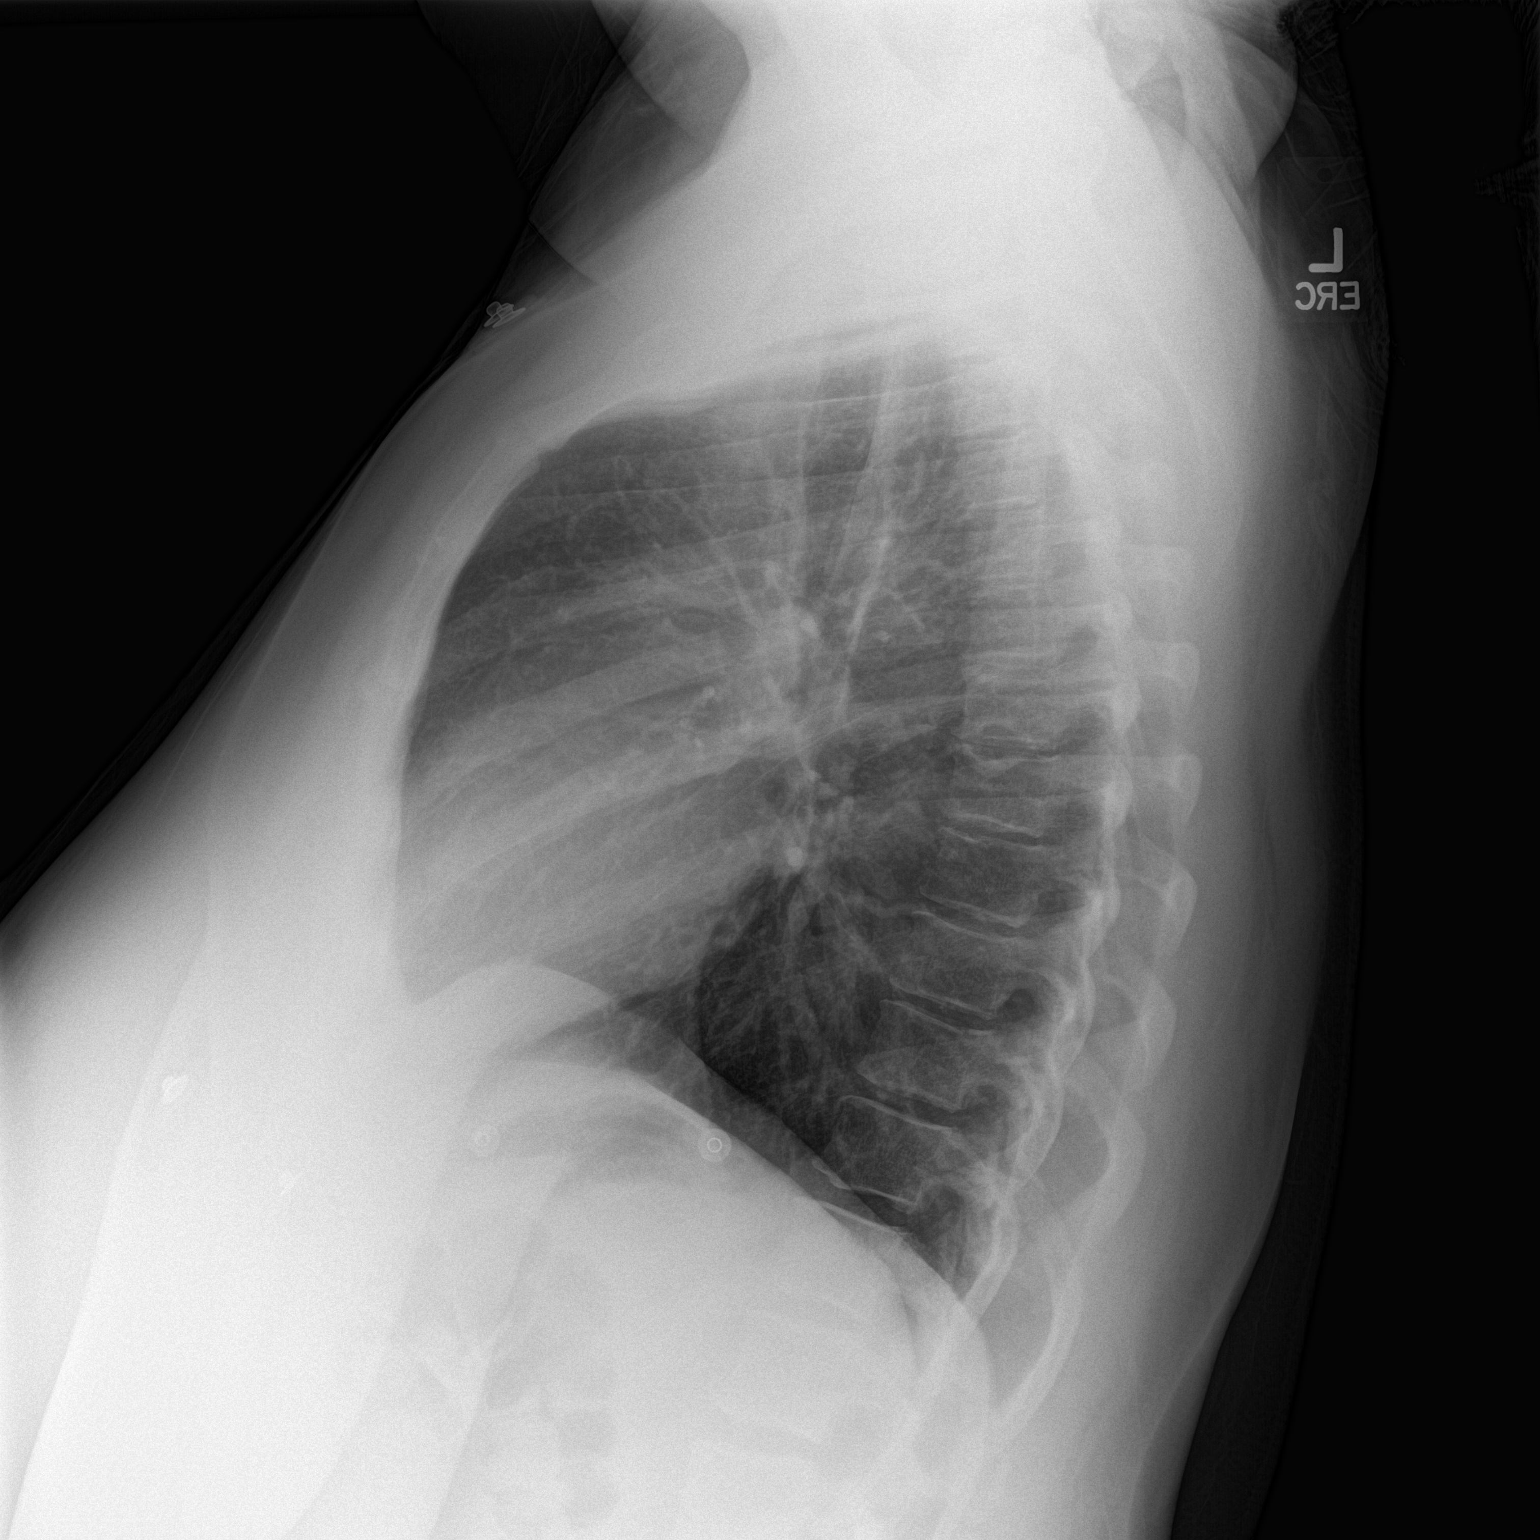

[2 of 2 positions shown; findings below may reference images not displayed]

FINDINGS: Cardiomediastinal silhouette is unremarkable. No acute infiltrate or
pleural effusion. No pulmonary edema. Bony thorax is unremarkable.
IMPRESSION: No active cardiopulmonary disease.

## 2016-08-26 ENCOUNTER — Ambulatory Visit: Payer: Self-pay | Admitting: Obstetrics

## 2016-09-18 ENCOUNTER — Ambulatory Visit: Payer: Self-pay | Admitting: Obstetrics

## 2019-05-26 NOTE — L&D Delivery Note (Signed)
OB/GYN Faculty Practice Delivery Note  Michelle Freeman is a 34 y.o. [id number] s/p vaginal delivery at [redacted]w[redacted]d. She was admitted for spontaneous onset of labor.  ROM: rupture date, rupture time, delivery date, or delivery time have not been documented with meconium stained fluid GBS Status: negative Maximum Maternal Temperature: 98.58F  Labor Progress: On admission pt with cervical exam 3/90/-3. She rapidly progressed to complete cervical dilation with SROM for meconium-stained fluid immediately prior to delivery as noted below.  Delivery Date/Time: 0636 on 04/10/20 Delivery: Called to room and infant was already delivered in the bed. Head delivered vertex. No nuchal cord present. Shoulder and body delivered in usual fashion. Infant with spontaneous cry, placed on mother's abdomen, dried and stimulated. Cord clamped x 2 after 1-minute delay, and cut by FOB under my direct supervision. Cord blood drawn. Placenta delivered spontaneously with gentle cord traction. Fundus firm with massage and Pitocin. Labia, perineum, vagina, and cervix were inspected, without evidence of lacerations.   Placenta: 3-vessel cord, intact, sent to L&D Complications: none Lacerations: none EBL: 175 ml Analgesia: IV fentanyl  Infant: female  APGARs 8 & 9  weight per medical record  Lynnda Shields, MD OB/GYN Fellow, Faculty Practice

## 2019-09-04 ENCOUNTER — Other Ambulatory Visit: Payer: Self-pay

## 2019-09-04 ENCOUNTER — Ambulatory Visit (INDEPENDENT_AMBULATORY_CARE_PROVIDER_SITE_OTHER): Payer: Medicare Other

## 2019-09-04 DIAGNOSIS — Z3200 Encounter for pregnancy test, result unknown: Secondary | ICD-10-CM

## 2019-09-04 DIAGNOSIS — Z3201 Encounter for pregnancy test, result positive: Secondary | ICD-10-CM | POA: Diagnosis not present

## 2019-09-04 DIAGNOSIS — Z3480 Encounter for supervision of other normal pregnancy, unspecified trimester: Secondary | ICD-10-CM

## 2019-09-04 LAB — POCT URINE PREGNANCY: Preg Test, Ur: POSITIVE — AB

## 2019-09-04 NOTE — Progress Notes (Signed)
Michelle Freeman is here for UPT. UPT positive. Pt reports LMP 07/16/19.  She think she may have had a short period in March.  New ob intake done today.  Pt advised to schedule new ob visit.   OB History  Gravida Para Term Preterm AB Living  5 2 2   2 2   SAB TAB Ectopic Multiple Live Births          2    # Outcome Date GA Lbr Len/2nd Weight Sex Delivery Anes PTL Lv  5 Current           4 Term 04/22/10 [redacted]w[redacted]d  6 lb 2 oz (2.778 kg)     LIV  3 AB 02/2009        DEC  2 Term 09/19/08 [redacted]w[redacted]d  6 lb 12 oz (3.062 kg) M    LIV  1 AB 10/2000        DEC   Assessment   Normal Pregnancy  New ob visit pending. -EH/RMA

## 2019-09-04 NOTE — Progress Notes (Signed)
Patient seen and assessed by nursing staff during this encounter. I have reviewed the chart and agree with the documentation and plan. I have also made any necessary editorial changes.  Catalina Antigua, MD 09/04/2019 1:57 PM

## 2019-10-04 ENCOUNTER — Ambulatory Visit (INDEPENDENT_AMBULATORY_CARE_PROVIDER_SITE_OTHER): Payer: Medicare Other | Admitting: Obstetrics and Gynecology

## 2019-10-04 ENCOUNTER — Other Ambulatory Visit: Payer: Self-pay

## 2019-10-04 ENCOUNTER — Encounter: Payer: Self-pay | Admitting: Obstetrics and Gynecology

## 2019-10-04 ENCOUNTER — Other Ambulatory Visit (HOSPITAL_COMMUNITY)
Admission: RE | Admit: 2019-10-04 | Discharge: 2019-10-04 | Disposition: A | Discharge status: Home / Self Care | Payer: Medicare Other | Source: Ambulatory Visit | Attending: Obstetrics and Gynecology | Admitting: Obstetrics and Gynecology

## 2019-10-04 VITALS — BP 101/66 | HR 94 | Temp 98.6°F | Wt 187.0 lb

## 2019-10-04 DIAGNOSIS — O09523 Supervision of elderly multigravida, third trimester: Secondary | ICD-10-CM | POA: Insufficient documentation

## 2019-10-04 DIAGNOSIS — Z3480 Encounter for supervision of other normal pregnancy, unspecified trimester: Secondary | ICD-10-CM

## 2019-10-04 DIAGNOSIS — Z3A11 11 weeks gestation of pregnancy: Secondary | ICD-10-CM | POA: Insufficient documentation

## 2019-10-04 DIAGNOSIS — Z1151 Encounter for screening for human papillomavirus (HPV): Secondary | ICD-10-CM | POA: Insufficient documentation

## 2019-10-04 DIAGNOSIS — Z124 Encounter for screening for malignant neoplasm of cervix: Secondary | ICD-10-CM | POA: Insufficient documentation

## 2019-10-04 DIAGNOSIS — Z3481 Encounter for supervision of other normal pregnancy, first trimester: Secondary | ICD-10-CM

## 2019-10-04 MED ORDER — BLOOD PRESSURE KIT DEVI: 1.0000 | 0 refills | Status: DC

## 2019-10-04 MED ORDER — BLOOD PRESSURE KIT DEVI
1.0000 | 0 refills | Status: DC
Start: 2019-10-04 — End: 2019-10-04

## 2019-10-04 NOTE — Progress Notes (Signed)
New OB, reports no problems today  

## 2019-10-04 NOTE — Progress Notes (Signed)
INITIAL PRENATAL VISIT NOTE  Subjective:  Michelle Freeman is a 34 y.o. [id number] at 79w3dby LMP being seen today for her initial prenatal visit. She does not know if she had a period in March and is unsure about dating. This is an unplanned pregnancy. She and partner are happy with the pregnancy. She was using nothing for birth control previously. She has an obstetric history significant for 2 x term SVD. She has a medical history significant for n/a.  Patient reports fatigue.  Contractions: Not present. Vag. Bleeding: None.   . Denies leaking of fluid.    Past Medical History:  Diagnosis Date  . Vitamin D deficiency     History reviewed. No pertinent surgical history.  OB History  Gravida Para Term Preterm AB Living  '5 2 2   2 2  '$ SAB TAB Ectopic Multiple Live Births          2    # Outcome Date GA Lbr Len/2nd Weight Sex Delivery Anes PTL Lv  5 Current           4 Term 04/22/10 332w0d6 lb 2 oz (2.778 kg)     LIV  3 AB 02/2009        DEC  2 Term 09/19/08 3860w0d lb 12 oz (3.062 kg) M    LIV  1 AB 10/2000        DEC    Social History   Socioeconomic History  . Marital status: Single    Spouse name: Not on file  . Number of children: Not on file  . Years of education: Not on file  . Highest education level: Not on file  Occupational History  . Not on file  Tobacco Use  . Smoking status: Former Smoker    Packs/day: 0.50    Types: Cigarettes  . Smokeless tobacco: Former UseSystems developer Quit date: 05/25/2015  Substance and Sexual Activity  . Alcohol use: No    Alcohol/week: 0.0 standard drinks    Comment: occasionally   . Drug use: Yes    Types: Marijuana  . Sexual activity: Yes    Partners: Male    Birth control/protection: Abstinence, None  Other Topics Concern  . Not on file  Social History Narrative  . Not on file   Social Determinants of Health   Financial Resource Strain:   . Difficulty of Paying Living Expenses:   Food Insecurity:   . Worried About  RunCharity fundraiser the Last Year:   . RanArboriculturist the Last Year:   Transportation Needs:   . LacFilm/video editoredical):   . LMarland Kitchenck of Transportation (Non-Medical):   Physical Activity:   . Days of Exercise per Week:   . Minutes of Exercise per Session:   Stress:   . Feeling of Stress :   Social Connections:   . Frequency of Communication with Friends and Family:   . Frequency of Social Gatherings with Friends and Family:   . Attends Religious Services:   . Active Member of Clubs or Organizations:   . Attends CluArchivistetings:   . MMarland Kitchenrital Status:     Family History  Problem Relation Age of Onset  . Diabetes Mother   . Obesity Mother   . Mental illness Mother      Current Outpatient Medications:  .  Blood Pressure Monitoring (BLOOD PRESSURE KIT) DEVI, 1 kit by Does not apply route  once a week. Check Blood Pressure regularly and record readings into the Babyscripts App.  Large Cuff.  DX O90.0, Disp: 1 each, Rfl: 0 .  etonogestrel (NEXPLANON) 68 MG IMPL implant, Inject 1 each into the skin once., Disp: , Rfl:  .  Multiple Vitamins-Minerals (BODY/HAIR/SKIN/NAILS PO), Take by mouth., Disp: , Rfl:  .  phentermine 37.5 MG capsule, Take 1 capsule (37.5 mg total) by mouth every morning. (Patient not taking: Reported on 07/16/2016), Disp: 30 capsule, Rfl: 2 .  traZODone (DESYREL) 50 MG tablet, Take 50 mg by mouth at bedtime., Disp: , Rfl:  .  Vitamin D, Ergocalciferol, (DRISDOL) 50000 units CAPS capsule, Take 50,000 Units by mouth every 7 (seven) days., Disp: , Rfl:   No Known Allergies  Review of Systems: Negative except for what is mentioned in HPI.  Objective:   Vitals:   10/04/19 1017  BP: 101/66  Pulse: 94  Temp: 98.6 F (37 C)  Weight: 187 lb (84.8 kg)    Fetal Status: Fetal Heart Rate (bpm): 154         Physical Exam: BP 101/66   Pulse 94   Temp 98.6 F (37 C)   Wt 187 lb (84.8 kg)   LMP 07/16/2019   BMI 32.10 kg/m  CONSTITUTIONAL:  Well-developed, well-nourished female in no acute distress.  NEUROLOGIC: Alert and oriented to person, place, and time. Normal reflexes, muscle tone coordination. No cranial nerve deficit noted. PSYCHIATRIC: Normal mood and affect. Normal behavior. Normal judgment and thought content. SKIN: Skin is warm and dry. No rash noted. Not diaphoretic. No erythema. No pallor. HENT:  Normocephalic, atraumatic, External right and left ear normal. Oropharynx is clear and moist EYES: Conjunctivae and EOM are normal. Pupils are equal, round, and reactive to light. No scleral icterus.  NECK: Normal range of motion, supple, no masses CARDIOVASCULAR: Normal heart rate noted, regular rhythm RESPIRATORY: Effort and breath sounds normal, no problems with respiration noted BREASTS: symmetric, non-tender, no masses palpable ABDOMEN: Soft, nontender, nondistended, gravid. GU: normal appearing external female genitalia, multiparous normal appearing cervix, scant white discharge in vagina, no lesions noted Bimanual: 8 weeks sized uterus, no adnexal tenderness or palpable lesions noted MUSCULOSKELETAL: Normal range of motion. EXT:  No edema and no tenderness. 2+ distal pulses.   Assessment and Plan:  Pregnancy: [id number] at 26w3dby LMP  1. Supervision of other normal pregnancy, antepartum Uterus approx 8 weeks, will obtain [place] dating - Hepatitis C Antibody - Culture, OB Urine - Obstetric Panel, Including HIV - Genetic Screening - Cervicovaginal ancillary only( [place]) - Enroll Patient in Babyscripts - Babyscripts Schedule Optimization - UKoreaMFM OB COMP + 14 WK; Future - Cytology - PAP( [place]) - UKoreaOB Comp Less 14 Wks; Future - RBeaconfor WWellPointstructure, multiple providers, fellows, medical students, virtual visits, MyChart.  - interested in waterbirth  Preterm labor symptoms and general obstetric precautions including but not limited to vaginal bleeding,  contractions, leaking of fluid and fetal movement were reviewed in detail with the patient.  Please refer to After Visit Summary for other counseling recommendations.   Return in about 4 weeks (around 11/01/2019) for low OB, virtual.  KSloan Leiter5/04/2020 11:11 AM

## 2019-10-05 LAB — CERVICOVAGINAL ANCILLARY ONLY
Bacterial Vaginitis (gardnerella): NEGATIVE
Candida Glabrata: NEGATIVE
Candida Vaginitis: NEGATIVE
Chlamydia: NEGATIVE
Comment: NEGATIVE
Comment: NEGATIVE
Comment: NEGATIVE
Comment: NEGATIVE
Comment: NEGATIVE
Comment: NORMAL
Neisseria Gonorrhea: NEGATIVE
Trichomonas: NEGATIVE

## 2019-10-05 LAB — OBSTETRIC PANEL, INCLUDING HIV
Antibody Screen: NEGATIVE
Basophils Absolute: 0 x10E3/uL (ref 0.0–0.2)
Basos: 0 %
EOS (ABSOLUTE): 0.2 x10E3/uL (ref 0.0–0.4)
Eos: 2 %
HIV Screen 4th Generation wRfx: NONREACTIVE
Hematocrit: 30.5 % — ABNORMAL LOW (ref 34.0–46.6)
Hemoglobin: 10 g/dL — ABNORMAL LOW (ref 11.1–15.9)
Hepatitis B Surface Ag: NEGATIVE
Immature Grans (Abs): 0 x10E3/uL (ref 0.0–0.1)
Immature Granulocytes: 0 %
Lymphocytes Absolute: 1.7 x10E3/uL (ref 0.7–3.1)
Lymphs: 18 %
MCH: 26.1 pg — ABNORMAL LOW (ref 26.6–33.0)
MCHC: 32.8 g/dL (ref 31.5–35.7)
MCV: 80 fL (ref 79–97)
Monocytes Absolute: 0.7 x10E3/uL (ref 0.1–0.9)
Monocytes: 8 %
Neutrophils Absolute: 6.4 x10E3/uL (ref 1.4–7.0)
Neutrophils: 72 %
Platelets: 291 x10E3/uL (ref 150–450)
RBC: 3.83 x10E6/uL (ref 3.77–5.28)
RDW: 16.5 % — ABNORMAL HIGH (ref 11.7–15.4)
RPR Ser Ql: NONREACTIVE
Rh Factor: POSITIVE
Rubella Antibodies, IGG: 2.86 {index} (ref >0.99)
WBC: 9 x10E3/uL (ref 3.4–10.8)

## 2019-10-05 LAB — CYTOLOGY - PAP
Comment: NEGATIVE
Diagnosis: NEGATIVE
High risk HPV: NEGATIVE

## 2019-10-05 LAB — HEPATITIS C ANTIBODY: Hep C Virus Ab: <0.1 {s_co_ratio} (ref 0.0–0.9)

## 2019-10-05 MED ORDER — FERROUS SULFATE 325 (65 FE) MG PO TABS: 325.0000 mg | ORAL_TABLET | Freq: Two times a day (BID) | ORAL | 1 refills | Status: DC

## 2019-10-05 NOTE — Addendum Note (Signed)
Addended by: Leroy Libman on: 10/05/2019 11:04 AM   Modules accepted: Orders

## 2019-10-06 LAB — CULTURE, OB URINE

## 2019-10-06 LAB — URINE CULTURE, OB REFLEX

## 2019-10-10 ENCOUNTER — Encounter: Payer: Self-pay | Admitting: Obstetrics and Gynecology

## 2019-10-13 ENCOUNTER — Ambulatory Visit
Admission: RE | Admit: 2019-10-13 | Discharge: 2019-10-13 | Disposition: A | Discharge status: Home / Self Care | Payer: Medicare Other | Source: Ambulatory Visit | Attending: Obstetrics and Gynecology | Admitting: Obstetrics and Gynecology

## 2019-10-13 ENCOUNTER — Other Ambulatory Visit: Payer: Self-pay

## 2019-10-13 DIAGNOSIS — Z3480 Encounter for supervision of other normal pregnancy, unspecified trimester: Secondary | ICD-10-CM | POA: Diagnosis not present

## 2019-10-17 ENCOUNTER — Encounter: Payer: Self-pay | Admitting: Obstetrics and Gynecology

## 2019-10-17 ENCOUNTER — Other Ambulatory Visit: Payer: Self-pay | Admitting: *Deleted

## 2019-10-17 DIAGNOSIS — Z3480 Encounter for supervision of other normal pregnancy, unspecified trimester: Secondary | ICD-10-CM

## 2019-10-17 DIAGNOSIS — R898 Other abnormal findings in specimens from other organs, systems and tissues: Secondary | ICD-10-CM | POA: Insufficient documentation

## 2019-10-26 ENCOUNTER — Other Ambulatory Visit: Payer: Self-pay

## 2019-10-26 ENCOUNTER — Ambulatory Visit: Payer: Self-pay | Admitting: Genetic Counselor

## 2019-10-26 ENCOUNTER — Ambulatory Visit: Payer: Medicare Other | Attending: Obstetrics and Gynecology | Admitting: Genetic Counselor

## 2019-10-26 DIAGNOSIS — Z315 Encounter for genetic counseling: Secondary | ICD-10-CM

## 2019-10-26 DIAGNOSIS — Z3A14 14 weeks gestation of pregnancy: Secondary | ICD-10-CM | POA: Diagnosis not present

## 2019-10-26 DIAGNOSIS — Z148 Genetic carrier of other disease: Secondary | ICD-10-CM

## 2019-10-26 NOTE — Progress Notes (Signed)
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 Michelle Freeman came to her appointment alone due to COVID-19 visitor restrictions.   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 female. Her current pregnancy has completed [redacted]w[redacted]d (Estimated Date of Delivery: 04/21/20). Michelle Freeman and her partner have an 53 year old daughter and a 59 year old daughter. Michelle Freeman has also had two elective terminations with the same partner.  Michelle Freeman denied exposure to environmental toxins or chemical agents. She denied the use of alcohol, tobacco or street drugs. She denied significant viral illnesses, fevers, and bleeding during the course of her pregnancy. Her medical and surgical histories were noncontributory.  Family History  A three generation pedigree was drafted and reviewed. The family history is remarkable for the following:  - Michelle Freeman's 24 year old daughter was born with a heart murmur that spontaneously closed. The exact etiology of this murmur is unknown and records are not available. Thus, risk assessment is limited.  The remaining family histories were reviewed and found to be noncontributory for birth defects, intellectual disability, recurrent pregnancy loss, and known genetic conditions.    The patient's ethnicity is [place] American, Caucasian, and Native American. The father of the pregnancy's ethnicity is [place] American, Caucasian, and Native Naval architect. Consanguinity was denied. Ms. Bushard was not sure if she or her partner have any Ashkenazi Jewish ancestry. Pedigree will be scanned under Media.  Discussion  Smith-Lemli-Opitz syndrome:  MichelleFreeman had Horizon-14carrier screeningperformedthrough [facility]. 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 SLOS is a condition that affects 1 in 20,000 to 1 in 60,000 newborns. It is characterized by distinctive facial features, microcephaly, intellectual disability, and behavioral problems such as autism. Infants with SLOS commonly have hypotonia, feeding difficulties, and growth delays. Most affected individuals have syndactyly (fusion) of the second and third toes, and some have extra fingers or toes (polydactyly). Malformations of the heart, lungs, kidneys, gastrointestinal tract, and genitalia are also common. The signs and symptoms of SLOS vary widely. Mildly affected individuals mayonlyhave minor physical abnormalities with learning and behavioral problems, whereas more severely affected individuals may have major physical abnormalities that can be life-threatening as well as profound intellectual disability.  SLOSis caused by pathogenic variantsin the DHCR7gene. The [facility] provides instructions for creatingan enzyme called 7-dehydrocholesterol reductase. This enzyme is responsible for the final step in thebody'sproduction of cholesterol. Cholesterol is a waxy, fat-like substance that is produced by the body and obtained through animal products in the diet. It is a structural component of cell membranes andthe substancethat protects nerve cells(myelin). Cholesterol also plays a role in the production ofvarioushormones and digestive acids. Pathogenic variants in theDHCR7gene disrupt the function of 7-dehydrocholesterol reductase,preventing cells from producing enough cholesterol. As a result, toxic byproducts of the cholesterol productionpathway build up in the blood, nervous system, and other tissues.  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 Prognosis for individuals with SLOSdepends on the precisecombination of  pathogenic variants they have in [facility]. Affected individuals who are homozygous for the c.964-1G>C variant (have two copies of the same variant)have severe symptoms that may be lethal. Affected individuals who are compound heterozygous for the c.964-1G>C variant and another pathogenic variant in the [facility] are mild-moderately affected, with clinical severity  depending on the other [facility]. Treatment for SLOS may include surgical correction of anomalies, dietary cholesterol supplementation, and Simvastatin, a medication that reduces thelevels of toxic byproducts of the cholesterol pathwayin the body.  SLOSis inherited in an autosomal recessive fashion. This means that the current fetus is only at risk forSLOSif MichelleFreeman's partner is also a carrierfor the condition.Thepan-ethnic carrier frequency forSLOS is 1 in 70. Based on the fact that the couple has two healthy daughters together, their chance of having a child with SLOS is (3/371)(1/2)(1/2) = 07/1482 (0.2%). If Michelle Freeman'spartner was also identified to be a carrier,the risk forSLOSin the current fetus and each of the couple's future pregnancies would be 1 in 4 (25%).  We discussed the implications of Michelle Freeman's carrier screening results for her relatives. Michelle Freeman children and parents each have a 1 in 2 (50%) chance of being a carrier for SLOS. If Michelle Freeman inherited her carrier status from her father, her paternal half brother and two paternal half sisters would have a 50% chance of being carriers. If Michelle Freeman inherited her carrier status from her mother, her maternal half brother and half sister would have a 50% chance of being carriers. I encouraged Michelle Freeman to discuss her carrier findings with her family members so that they may pursue carrier screening for SLOS if desired. We also discussed that genetic counseling and carrier screening will be recommended for her children when they are considering having  children of their own in the future. If they are identified as carriers, it would be recommended that their reproductive partners undergo SLOS carrier screening.  Ms. Granlund carrier screening was negative for the other 13 conditions screened. Thus, her risk to be a carrier for these additional conditions (listed separately in the laboratory report) has been reduced but not eliminated. This also significantly reduces her risk of having a child affected by one of these conditions. We discussed that carrier testing for SLOS is recommended for Ms. Roanhorse's partner. Ms. Christman indicated that she is not interested in pursuing partner carrier screening. She felt reassured by the risk estimate that was presented today.  Aneuploidy screening:  We also reviewed that Ms. Bogart had Panorama NIPS through the laboratory [facility] that was low-risk for fetal aneuploidies. We reviewed that these results showed a less than 1 in 10,000 risk for trisomies 21, 18 and 13, and monosomy X (Turner syndrome).  In addition, the risk for triploidy and sex chromosome trisomies (47,XXX and 47,XXY) was also low. Ms. Gutzmer elected to have cfDNA analysis for 22q11.2 deletion syndrome, which was also low risk (1 in 9000). We reviewed that while this testing identifies 94-99% of pregnancies with trisomy 108, trisomy 73, trisomy 25, sex chromosome aneuploidies, and triploidy, it is NOT diagnostic. A positive test result requires confirmation by CVS or amniocentesis, and a negative test result does not rule out a fetal chromosome abnormality. She also understands that this testing does not identify all genetic conditions.  Diagnostic testing:  Ms. Laprade was also counseled regarding diagnostic testing via amniocentesis available beginning at 16 weeks' gestation. We discussed the technical aspects of the procedure and quoted up to a 1 in 500 (0.2%) risk for spontaneous pregnancy loss or other adverse pregnancy outcomes as a result of  amniocentesis. Cultured cells from an amniocentesis sample allow for the visualization of a fetal karyotype, which can detect >99% of chromosomal aberrations. Chromosomal microarray can also be performed to identify smaller deletions or duplications of fetal chromosomal material. Amniocentesis could  also be performed to assess whether the baby is affected by SLOS. Ms. Nott was not interested in undergoing amniocentesis. She understands that amniocentesis is available at any point after 16 weeks of pregnancy and that she may opt to undergo the procedure at a later date should she change her mind.  Plan:  Additional screening and diagnostic testing were declined today. She understands that screening tests, including ultrasound, cannot rule out all birth defects or genetic syndromes. The patient was advised of this limitation and states she still does not want additional testing or screening at this time.   I counseled Ms. Prime regarding the above risks and available options. The approximate face-to-face time with the genetic counselor was 45 minutes.  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

## 2019-11-01 ENCOUNTER — Telehealth (INDEPENDENT_AMBULATORY_CARE_PROVIDER_SITE_OTHER): Payer: Medicare Other | Admitting: Nurse Practitioner

## 2019-11-01 DIAGNOSIS — Z5329 Procedure and treatment not carried out because of patient's decision for other reasons: Secondary | ICD-10-CM

## 2019-11-01 NOTE — Progress Notes (Signed)
Virtual ROB   CC:    

## 2019-11-06 ENCOUNTER — Ambulatory Visit (INDEPENDENT_AMBULATORY_CARE_PROVIDER_SITE_OTHER): Payer: Medicare Other | Admitting: Advanced Practice Midwife

## 2019-11-06 ENCOUNTER — Other Ambulatory Visit: Payer: Self-pay

## 2019-11-06 VITALS — BP 115/67 | HR 91 | Wt 191.0 lb

## 2019-11-06 DIAGNOSIS — O219 Vomiting of pregnancy, unspecified: Secondary | ICD-10-CM

## 2019-11-06 DIAGNOSIS — Z3480 Encounter for supervision of other normal pregnancy, unspecified trimester: Secondary | ICD-10-CM

## 2019-11-06 DIAGNOSIS — Z3A16 16 weeks gestation of pregnancy: Secondary | ICD-10-CM

## 2019-11-06 DIAGNOSIS — O26812 Pregnancy related exhaustion and fatigue, second trimester: Secondary | ICD-10-CM

## 2019-11-06 MED ORDER — BLOOD PRESSURE KIT DEVI: 1.0000 | 0 refills | Status: AC

## 2019-11-06 MED ORDER — FERROUS SULFATE 325 (65 FE) MG PO TABS: 325.0000 mg | ORAL_TABLET | Freq: Every day | ORAL | 5 refills | Status: DC

## 2019-11-06 MED ORDER — FERROUS SULFATE 325 (65 FE) MG PO TABS: 325.0000 mg | ORAL_TABLET | Freq: Two times a day (BID) | ORAL | 1 refills | Status: DC

## 2019-11-06 NOTE — Progress Notes (Signed)
   PRENATAL VISIT NOTE  Subjective:  Michelle Freeman is a 34 y.o. [id number] at [redacted]w[redacted]d being seen today for ongoing prenatal care.  She is currently monitored for the following issues for this low-risk pregnancy and has Obesity; Routine gynecological examination; Supervision of other normal pregnancy, antepartum; and Abnormal genetic test on their problem list.  Patient reports fatigue and nausea.  Contractions: Not present.  .  Movement: Present. Denies leaking of fluid.   The following portions of the patient's history were reviewed and updated as appropriate: allergies, current medications, past family history, past medical history, past social history, past surgical history and problem list.   Objective:   Vitals:   11/06/19 1509  BP: 115/67  Pulse: 91  Weight: 191 lb (86.6 kg)    Fetal Status: Fetal Heart Rate (bpm): 155   Movement: Present     General:  Alert, oriented and cooperative. Patient is in no acute distress.  Skin: Skin is warm and dry. No rash noted.   Cardiovascular: Normal heart rate noted  Respiratory: Normal respiratory effort, no problems with respiration noted  Abdomen: Soft, gravid, appropriate for gestational age.  Pain/Pressure: Absent     Pelvic: Cervical exam deferred        Extremities: Normal range of motion.     Mental Status: Normal mood and affect. Normal behavior. Normal judgment and thought content.   Assessment and Plan:  Pregnancy: [id number] at [redacted]w[redacted]d 1. Supervision of other normal pregnancy, antepartum --Anticipatory guidance about next visits/weeks of pregnancy given. --Next visit in 4 weeks in office per pt preference.  2. Nausea and vomiting during pregnancy prior to [redacted] weeks gestation --Improved since first trimester but not resolved.  Eating large meal infrequently, recommend small frequent meals instead.  Pt denies need for medications.   3. Fatigue during pregnancy, antepartum, second trimester --May be related to anemia, Hgb 10.0 at new  OB. Not consistently taking PNV. --Rx for ferrous sulfate to pharmacy, take PNV and iron tablet daily. --Increase iron rich foods, discussed at visit today.  Preterm labor symptoms and general obstetric precautions including but not limited to vaginal bleeding, contractions, leaking of fluid and fetal movement were reviewed in detail with the patient. Please refer to After Visit Summary for other counseling recommendations.   No follow-ups on file.  Future Appointments  Date Time Provider Department Center  11/28/2019 10:30 AM [facility] NURSE [facility]  11/28/2019 10:30 AM [facility] US3 [facility]    Sharen Counter, CNM

## 2019-11-06 NOTE — Progress Notes (Signed)
Pt states that she is sleeping too much, have urinary frequency but no other symptoms, pt is still having N&V. Discussed how to eat smaller meals/snacks.   Pt has appt with [facility] regarding genetic testing.   Pt is feeling occ. movement.

## 2019-11-06 NOTE — Patient Instructions (Signed)
Considering Waterbirth? °Guide for patients at [facility] °Why consider waterbirth? °• Gentle birth for babies  °• Less pain medicine used in labor  °• May allow for passive descent/less pushing  °• May reduce perineal tears  °• More mobility and instinctive maternal position changes  °• Increased maternal relaxation  °• Reduced blood pressure in labor  ° °Is waterbirth safe? What are the risks of infection, drowning or other complications? °• Infection:  °• Very low risk (3.7 % for tub vs 4.8% for bed)  °• 7 in 8000 waterbirths with documented infection  °• Poorly cleaned equipment most common cause  °• Slightly lower group B strep transmission rate  °• Drowning  °• Maternal:  °• Very low risk  °• Related to seizures or fainting  °• Newborn:  °• Very low risk. No evidence of increased risk of respiratory problems in multiple large studies  °• Physiological protection from breathing under water  °• Avoid underwater birth if there are any fetal complications  °• Once baby's head is out of the water, keep it out.  °• Birth complication  °• Some reports of cord trauma, but risk decreased by bringing baby to surface gradually  °• No evidence of increased risk of shoulder dystocia. Mothers can usually change positions faster in water than in a bed, possibly aiding the maneuvers to free the shoulder.  °? °You must attend a Waterbirth class at [facility] [place] °• 3rd Wednesday of every month from 7-9pm  °• Free  °• Register by calling [phone] or online at [email or web address]  °• Bring us the certificate from the class to your prenatal appointment  °Meet with a midwife at 36 weeks to see if you can still plan a waterbirth and to sign the consent.  ° °If you plan a waterbirth at [facility] [place], you can opt to purchase the following: °• Fish Net °• Bathing suit top (optional)  °• Long-handled mirror (optional)  °•  °Things that would  prevent you from having a waterbirth: °• Unknown or Positive COVID-19 diagnosis upon admission to hospital  °• Premature, <37wks  °• Previous cesarean birth  °• Presence of thick meconium-stained fluid  °• Multiple gestation (Twins, triplets, etc.)  °• Uncontrolled diabetes or gestational diabetes requiring medication  °• Hypertension requiring medication or diagnosis of pre-eclampsia  °• Heavy vaginal bleeding  °• Non-reassuring fetal heart rate  °• Active infection (MRSA, etc.). Group B Strep is NOT a contraindication for waterbirth.  °• If your labor has to be induced and induction method requires continuous monitoring of the baby's heart rate  °• Other risks/issues identified by your obstetrical provider  °Please remember that birth is unpredictable. Under certain unforeseeable circumstances your provider may advise against giving birth in the tub. These decisions will be made on a case-by-case basis and with the safety of you and your baby as our highest priority. ° °**Please remember that in order to have a waterbirth, you must test Negative to COVID-19 upon admission to the hospital.** ° °

## 2019-11-26 ENCOUNTER — Emergency Department
Admission: EM | Admit: 2019-11-26 | Discharge: 2019-11-26 | Disposition: A | Discharge status: Home / Self Care | Payer: Medicare Other | Attending: Emergency Medicine | Admitting: Emergency Medicine

## 2019-11-26 ENCOUNTER — Encounter: Payer: Self-pay | Admitting: Emergency Medicine

## 2019-11-26 ENCOUNTER — Other Ambulatory Visit: Payer: Self-pay

## 2019-11-26 DIAGNOSIS — Z87891 Personal history of nicotine dependence: Secondary | ICD-10-CM | POA: Diagnosis not present

## 2019-11-26 DIAGNOSIS — X102XXA Contact with fats and cooking oils, initial encounter: Secondary | ICD-10-CM | POA: Insufficient documentation

## 2019-11-26 DIAGNOSIS — Y93G3 Activity, cooking and baking: Secondary | ICD-10-CM | POA: Diagnosis not present

## 2019-11-26 DIAGNOSIS — Y92090 Kitchen in other non-institutional residence as the place of occurrence of the external cause: Secondary | ICD-10-CM | POA: Diagnosis not present

## 2019-11-26 DIAGNOSIS — O9A212 Injury, poisoning and certain other consequences of external causes complicating pregnancy, second trimester: Secondary | ICD-10-CM | POA: Diagnosis present

## 2019-11-26 DIAGNOSIS — T24212A Burn of second degree of left thigh, initial encounter: Secondary | ICD-10-CM | POA: Diagnosis not present

## 2019-11-26 DIAGNOSIS — Z3A18 18 weeks gestation of pregnancy: Secondary | ICD-10-CM | POA: Diagnosis not present

## 2019-11-26 DIAGNOSIS — Y999 Unspecified external cause status: Secondary | ICD-10-CM | POA: Diagnosis not present

## 2019-11-26 MED ORDER — BACITRACIN ZINC 500 UNIT/GM EX OINT: TOPICAL_OINTMENT | Freq: Two times a day (BID) | CUTANEOUS | Status: DC
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
## 2019-11-28 ENCOUNTER — Other Ambulatory Visit: Payer: Self-pay

## 2019-11-28 ENCOUNTER — Ambulatory Visit: Payer: Medicare Other

## 2019-12-04 ENCOUNTER — Encounter: Payer: Medicare Other | Admitting: Advanced Practice Midwife

## 2019-12-11 ENCOUNTER — Other Ambulatory Visit: Payer: Self-pay

## 2019-12-11 ENCOUNTER — Ambulatory Visit (INDEPENDENT_AMBULATORY_CARE_PROVIDER_SITE_OTHER): Payer: Medicare Other | Admitting: Advanced Practice Midwife

## 2019-12-11 VITALS — BP 108/75 | HR 88 | Wt 190.0 lb

## 2019-12-11 DIAGNOSIS — O219 Vomiting of pregnancy, unspecified: Secondary | ICD-10-CM

## 2019-12-11 DIAGNOSIS — Z3A21 21 weeks gestation of pregnancy: Secondary | ICD-10-CM

## 2019-12-11 DIAGNOSIS — Z3480 Encounter for supervision of other normal pregnancy, unspecified trimester: Secondary | ICD-10-CM

## 2019-12-11 DIAGNOSIS — O26812 Pregnancy related exhaustion and fatigue, second trimester: Secondary | ICD-10-CM

## 2019-12-11 NOTE — Progress Notes (Signed)
   PRENATAL VISIT NOTE  Subjective:  Michelle Freeman is a 34 y.o. [id number] at [redacted]w[redacted]d being seen today at [facility] for ongoing prenatal care.  She is currently monitored for the following issues for this low-risk pregnancy and has Obesity; Routine gynecological examination; Supervision of other normal pregnancy, antepartum; and Abnormal genetic test on their problem list.  Patient reports no complaints.  Contractions: Not present. Vag. Bleeding: None.  Movement: Present. Denies leaking of fluid.   The following portions of the patient's history were reviewed and updated as appropriate: allergies, current medications, past family history, past medical history, past social history, past surgical history and problem list.   Objective:   Vitals:   12/11/19 1338  BP: 108/75  Pulse: 88  Weight: 190 lb (86.2 kg)    Fetal Status: Fetal Heart Rate (bpm): 155   Movement: Present     General:  Alert, oriented and cooperative. Patient is in no acute distress.  Skin: Skin is warm and dry. No rash noted.   Cardiovascular: Normal heart rate noted  Respiratory: Normal respiratory effort, no problems with respiration noted  Abdomen: Soft, gravid, appropriate for gestational age.  Pain/Pressure: Absent     Pelvic: Cervical exam deferred        Extremities: Normal range of motion.     Mental Status: Normal mood and affect. Normal behavior. Normal judgment and thought content.   Assessment and Plan:  Pregnancy: [id number] at [redacted]w[redacted]d 1. Supervision of other normal pregnancy, antepartum --Anticipatory guidance about next visits/weeks of pregnancy given. --Next visit in 4 weeks  2. Nausea and vomiting during pregnancy prior to [redacted] weeks gestation --Improved symptoms since last visit  3. Fatigue during pregnancy, antepartum, second trimester --Improved, pt has not taken extra iron and is not taking PNV.  Pt to pick up ferrous sulfate and take daily or every other day if GI symptoms.  Preterm labor  symptoms and general obstetric precautions including but not limited to vaginal bleeding, contractions, leaking of fluid and fetal movement were reviewed in detail with the patient. Please refer to After Visit Summary for other counseling recommendations.   Return in about 4 weeks (around 01/08/2020).  No future appointments.  Sharen Counter, CNM

## 2019-12-11 NOTE — Patient Instructions (Signed)
Considering Waterbirth? °Guide for patients at [facility] °Why consider waterbirth? °• Gentle birth for babies  °• Less pain medicine used in labor  °• May allow for passive descent/less pushing  °• May reduce perineal tears  °• More mobility and instinctive maternal position changes  °• Increased maternal relaxation  °• Reduced blood pressure in labor  ° °Is waterbirth safe? What are the risks of infection, drowning or other complications? °• Infection:  °• Very low risk (3.7 % for tub vs 4.8% for bed)  °• 7 in 8000 waterbirths with documented infection  °• Poorly cleaned equipment most common cause  °• Slightly lower group B strep transmission rate  °• Drowning  °• Maternal:  °• Very low risk  °• Related to seizures or fainting  °• Newborn:  °• Very low risk. No evidence of increased risk of respiratory problems in multiple large studies  °• Physiological protection from breathing under water  °• Avoid underwater birth if there are any fetal complications  °• Once baby's head is out of the water, keep it out.  °• Birth complication  °• Some reports of cord trauma, but risk decreased by bringing baby to surface gradually  °• No evidence of increased risk of shoulder dystocia. Mothers can usually change positions faster in water than in a bed, possibly aiding the maneuvers to free the shoulder.  °? °You must attend a Waterbirth class at [facility] [place] °• 3rd Wednesday of every month from 7-9pm  °• Free  °• Register by calling [phone] or online at [email or web address]  °• Bring us the certificate from the class to your prenatal appointment  °Meet with a midwife at 36 weeks to see if you can still plan a waterbirth and to sign the consent.  ° °If you plan a waterbirth at [facility] [place], you can opt to purchase the following: °• Fish Net °• Bathing suit top (optional)  °• Long-handled mirror (optional)  °•  °Things that would  prevent you from having a waterbirth: °• Unknown or Positive COVID-19 diagnosis upon admission to hospital  °• Premature, <37wks  °• Previous cesarean birth  °• Presence of thick meconium-stained fluid  °• Multiple gestation (Twins, triplets, etc.)  °• Uncontrolled diabetes or gestational diabetes requiring medication  °• Hypertension requiring medication or diagnosis of pre-eclampsia  °• Heavy vaginal bleeding  °• Non-reassuring fetal heart rate  °• Active infection (MRSA, etc.). Group B Strep is NOT a contraindication for waterbirth.  °• If your labor has to be induced and induction method requires continuous monitoring of the baby's heart rate  °• Other risks/issues identified by your obstetrical provider  °Please remember that birth is unpredictable. Under certain unforeseeable circumstances your provider may advise against giving birth in the tub. These decisions will be made on a case-by-case basis and with the safety of you and your baby as our highest priority. ° °**Please remember that in order to have a waterbirth, you must test Negative to COVID-19 upon admission to the hospital.** ° °

## 2019-12-15 ENCOUNTER — Other Ambulatory Visit: Payer: Self-pay | Admitting: *Deleted

## 2019-12-15 ENCOUNTER — Ambulatory Visit: Payer: Medicare Other | Admitting: *Deleted

## 2019-12-15 ENCOUNTER — Other Ambulatory Visit: Payer: Self-pay

## 2019-12-15 ENCOUNTER — Ambulatory Visit: Payer: Medicare Other | Attending: Obstetrics and Gynecology

## 2019-12-15 VITALS — BP 105/65 | HR 94

## 2019-12-15 DIAGNOSIS — O281 Abnormal biochemical finding on antenatal screening of mother: Secondary | ICD-10-CM | POA: Diagnosis not present

## 2019-12-15 DIAGNOSIS — Z363 Encounter for antenatal screening for malformations: Secondary | ICD-10-CM

## 2019-12-15 DIAGNOSIS — O09522 Supervision of elderly multigravida, second trimester: Secondary | ICD-10-CM

## 2019-12-15 DIAGNOSIS — O0990 Supervision of high risk pregnancy, unspecified, unspecified trimester: Secondary | ICD-10-CM | POA: Insufficient documentation

## 2019-12-15 DIAGNOSIS — Z3480 Encounter for supervision of other normal pregnancy, unspecified trimester: Secondary | ICD-10-CM

## 2019-12-15 DIAGNOSIS — Z148 Genetic carrier of other disease: Secondary | ICD-10-CM

## 2019-12-15 DIAGNOSIS — O09529 Supervision of elderly multigravida, unspecified trimester: Secondary | ICD-10-CM

## 2019-12-15 DIAGNOSIS — Z3A21 21 weeks gestation of pregnancy: Secondary | ICD-10-CM

## 2019-12-15 DIAGNOSIS — O99212 Obesity complicating pregnancy, second trimester: Secondary | ICD-10-CM

## 2019-12-15 DIAGNOSIS — E669 Obesity, unspecified: Secondary | ICD-10-CM

## 2020-01-11 ENCOUNTER — Encounter: Payer: Medicare Other | Admitting: Certified Nurse Midwife

## 2020-01-12 ENCOUNTER — Ambulatory Visit: Payer: Medicare Other | Attending: Obstetrics and Gynecology

## 2020-01-12 ENCOUNTER — Encounter: Payer: Self-pay | Admitting: *Deleted

## 2020-01-12 ENCOUNTER — Ambulatory Visit: Payer: Medicare Other | Admitting: *Deleted

## 2020-01-12 ENCOUNTER — Other Ambulatory Visit: Payer: Self-pay

## 2020-01-12 VITALS — BP 115/71 | HR 85

## 2020-01-12 DIAGNOSIS — O281 Abnormal biochemical finding on antenatal screening of mother: Secondary | ICD-10-CM

## 2020-01-12 DIAGNOSIS — O09529 Supervision of elderly multigravida, unspecified trimester: Secondary | ICD-10-CM | POA: Insufficient documentation

## 2020-01-12 DIAGNOSIS — Z362 Encounter for other antenatal screening follow-up: Secondary | ICD-10-CM

## 2020-01-12 DIAGNOSIS — O09522 Supervision of elderly multigravida, second trimester: Secondary | ICD-10-CM

## 2020-01-12 DIAGNOSIS — E669 Obesity, unspecified: Secondary | ICD-10-CM

## 2020-01-12 DIAGNOSIS — Z148 Genetic carrier of other disease: Secondary | ICD-10-CM

## 2020-01-12 DIAGNOSIS — O99212 Obesity complicating pregnancy, second trimester: Secondary | ICD-10-CM | POA: Diagnosis not present

## 2020-01-12 DIAGNOSIS — Z3A25 25 weeks gestation of pregnancy: Secondary | ICD-10-CM

## 2020-03-12 ENCOUNTER — Encounter: Payer: Self-pay | Admitting: Obstetrics and Gynecology

## 2020-03-12 ENCOUNTER — Other Ambulatory Visit: Payer: Self-pay

## 2020-03-12 ENCOUNTER — Ambulatory Visit (INDEPENDENT_AMBULATORY_CARE_PROVIDER_SITE_OTHER): Payer: Medicare Other | Admitting: Obstetrics and Gynecology

## 2020-03-12 VITALS — BP 119/70 | HR 99 | Wt 211.1 lb

## 2020-03-12 DIAGNOSIS — Z3480 Encounter for supervision of other normal pregnancy, unspecified trimester: Secondary | ICD-10-CM

## 2020-03-12 NOTE — Progress Notes (Signed)
Patient presents for ROB. 

## 2020-03-12 NOTE — Progress Notes (Signed)
   PRENATAL VISIT NOTE  Subjective:  Michelle Freeman is a 34 y.o. [phone] at [redacted]w[redacted]d being seen today for ongoing prenatal care.  She is currently monitored for the following issues for this low-risk pregnancy and has Obesity; Supervision of other normal pregnancy, antepartum; and Abnormal genetic test on their problem list.  Patient reports no complaints.  Contractions: Not present. Vag. Bleeding: None.  Movement: Present. Denies leaking of fluid.   The following portions of the patient's history were reviewed and updated as appropriate: allergies, current medications, past family history, past medical history, past social history, past surgical history and problem list.   Objective:   Vitals:   03/12/20 1357  BP: 119/70  Pulse: 99  Weight: 211 lb 1.6 oz (95.8 kg)    Fetal Status: Fetal Heart Rate (bpm): 143 Fundal Height: 34 cm Movement: Present     General:  Alert, oriented and cooperative. Patient is in no acute distress.  Skin: Skin is warm and dry. No rash noted.   Cardiovascular: Normal heart rate noted  Respiratory: Normal respiratory effort, no problems with respiration noted  Abdomen: Soft, gravid, appropriate for gestational age.  Pain/Pressure: Absent     Pelvic: Cervical exam deferred        Extremities: Normal range of motion.  Edema: Trace  Mental Status: Normal mood and affect. Normal behavior. Normal judgment and thought content.   Assessment and Plan:  Pregnancy: [id number] at [redacted]w[redacted]d 1. Supervision of other normal pregnancy, antepartum Patient is doing well without complaints Undecided on pediatrician Patient does not want contraception Patient missed glucola appointment- will be scheduled prior to her next appointment  Preterm labor symptoms and general obstetric precautions including but not limited to vaginal bleeding, contractions, leaking of fluid and fetal movement were reviewed in detail with the patient. Please refer to After Visit Summary for other  counseling recommendations.   Return in about 2 weeks (around 03/26/2020) for in person, ROB, Low risk.  No future appointments.  Catalina Antigua, MD

## 2020-03-19 ENCOUNTER — Other Ambulatory Visit: Payer: Medicare Other

## 2020-03-19 ENCOUNTER — Other Ambulatory Visit: Payer: Self-pay

## 2020-03-19 DIAGNOSIS — Z114 Encounter for screening for human immunodeficiency virus [HIV]: Secondary | ICD-10-CM

## 2020-03-19 DIAGNOSIS — Z3480 Encounter for supervision of other normal pregnancy, unspecified trimester: Secondary | ICD-10-CM

## 2020-03-19 DIAGNOSIS — O0992 Supervision of high risk pregnancy, unspecified, second trimester: Secondary | ICD-10-CM

## 2020-03-19 DIAGNOSIS — Z113 Encounter for screening for infections with a predominantly sexual mode of transmission: Secondary | ICD-10-CM

## 2020-03-19 DIAGNOSIS — O0991 Supervision of high risk pregnancy, unspecified, first trimester: Secondary | ICD-10-CM

## 2020-03-20 ENCOUNTER — Encounter: Payer: Self-pay | Admitting: Obstetrics & Gynecology

## 2020-03-20 ENCOUNTER — Other Ambulatory Visit: Payer: Self-pay | Admitting: Obstetrics & Gynecology

## 2020-03-20 DIAGNOSIS — D509 Iron deficiency anemia, unspecified: Secondary | ICD-10-CM | POA: Insufficient documentation

## 2020-03-20 DIAGNOSIS — Z3A35 35 weeks gestation of pregnancy: Secondary | ICD-10-CM

## 2020-03-20 LAB — CBC
Hematocrit: 27.6 % — ABNORMAL LOW (ref 34.0–46.6)
Hemoglobin: 8.9 g/dL — ABNORMAL LOW (ref 11.1–15.9)
MCH: 24.8 pg — ABNORMAL LOW (ref 26.6–33.0)
MCHC: 32.2 g/dL (ref 31.5–35.7)
MCV: 77 fL — ABNORMAL LOW (ref 79–97)
Platelets: 308 10*3/uL (ref 150–450)
RBC: 3.59 x10E6/uL — ABNORMAL LOW (ref 3.77–5.28)
RDW: 16.1 % — ABNORMAL HIGH (ref 11.7–15.4)
WBC: 11.9 10*3/uL — ABNORMAL HIGH (ref 3.4–10.8)

## 2020-03-20 LAB — GLUCOSE TOLERANCE, 2 HOURS W/ 1HR
Glucose, 1 hour: 115 mg/dL (ref 65–179)
Glucose, 2 hour: 54 mg/dL — ABNORMAL LOW (ref 65–152)
Glucose, Fasting: 80 mg/dL (ref 65–91)

## 2020-03-20 LAB — HIV ANTIBODY (ROUTINE TESTING W REFLEX): HIV Screen 4th Generation wRfx: NONREACTIVE

## 2020-03-20 LAB — RPR: RPR Ser Ql: NONREACTIVE

## 2020-03-26 ENCOUNTER — Other Ambulatory Visit: Payer: Self-pay

## 2020-03-26 ENCOUNTER — Ambulatory Visit (INDEPENDENT_AMBULATORY_CARE_PROVIDER_SITE_OTHER): Payer: Medicare Other | Admitting: Advanced Practice Midwife

## 2020-03-26 ENCOUNTER — Other Ambulatory Visit (HOSPITAL_COMMUNITY)
Admission: RE | Admit: 2020-03-26 | Discharge: 2020-03-26 | Disposition: A | Discharge status: Home / Self Care | Payer: Medicare Other | Source: Ambulatory Visit | Attending: Advanced Practice Midwife | Admitting: Advanced Practice Midwife

## 2020-03-26 VITALS — BP 120/81 | HR 106 | Wt 217.0 lb

## 2020-03-26 DIAGNOSIS — Z3480 Encounter for supervision of other normal pregnancy, unspecified trimester: Secondary | ICD-10-CM

## 2020-03-26 DIAGNOSIS — Z3A36 36 weeks gestation of pregnancy: Secondary | ICD-10-CM

## 2020-03-26 DIAGNOSIS — D509 Iron deficiency anemia, unspecified: Secondary | ICD-10-CM

## 2020-03-26 DIAGNOSIS — O99013 Anemia complicating pregnancy, third trimester: Secondary | ICD-10-CM

## 2020-03-26 DIAGNOSIS — R898 Other abnormal findings in specimens from other organs, systems and tissues: Secondary | ICD-10-CM

## 2020-03-26 NOTE — Progress Notes (Signed)
   PRENATAL VISIT NOTE  Subjective:  Michelle Freeman is a 34 y.o. [phone] at [redacted]w[redacted]d being seen today for ongoing prenatal care.  She is currently monitored for the following issues for this low-risk pregnancy and has Obesity; Supervision of other normal pregnancy, antepartum; Abnormal genetic test; Maternal iron deficiency anemia complicating pregnancy, third trimester; and [redacted] weeks gestation of pregnancy on their problem list.  Patient reports occasional contractions.  Contractions: Irregular. Vag. Bleeding: None.  Movement: Present. Denies leaking of fluid.   The following portions of the patient's history were reviewed and updated as appropriate: allergies, current medications, past family history, past medical history, past social history, past surgical history and problem list.   Objective:   Vitals:   03/26/20 1418  BP: 120/81  Pulse: (!) 106  Weight: 217 lb (98.4 kg)    Fetal Status: Fetal Heart Rate (bpm): 142 Fundal Height: 36 cm Movement: Present  Presentation: Vertex  General:  Alert, oriented and cooperative. Patient is in no acute distress.  Skin: Skin is warm and dry. No rash noted.   Cardiovascular: Normal heart rate noted  Respiratory: Normal respiratory effort, no problems with respiration noted  Abdomen: Soft, gravid, appropriate for gestational age.  Pain/Pressure: Absent     Pelvic: Cervical exam performed in the presence of a chaperone Dilation: 1 Effacement (%): 40 Station: -2  Extremities: Normal range of motion.  Edema: Trace  Mental Status: Normal mood and affect. Normal behavior. Normal judgment and thought content.   Assessment and Plan:  Pregnancy: [id number] at [redacted]w[redacted]d 1. Supervision of other normal pregnancy, antepartum --Anticipatory guidance about next visits/weeks of pregnancy given. --Next visit in 1 week with midwife --Had interest in waterbirth but didn't attend class so thought she couldn't.  Had 2 natural births with her other babies so is a good  candidate. --Consent for WB done today and message sent to [facility] class instructor to add her to class this week or do private class if possible --If pt attends class, will be eligible for WB - Cervicovaginal ancillary only( [place]) - Strep Gp B NAA  2. Maternal iron deficiency anemia complicating pregnancy, third trimester   3. 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 [redacted] weeks gestation of pregnancy   Term labor symptoms and general obstetric precautions including but not limited to vaginal bleeding, contractions, leaking of fluid and fetal movement were reviewed in detail with the patient. Please refer to After Visit Summary for other counseling recommendations.   Return in 2 weeks (on 04/09/2020) for [facility], in person.  Future Appointments  Date Time Provider Department Center  04/09/2020  2:00 PM Leftwich-Kirby, Wilmer Floor, CNM [facility] None    Sharen Counter, CNM

## 2020-03-26 NOTE — Patient Instructions (Signed)

## 2020-03-27 LAB — CERVICOVAGINAL ANCILLARY ONLY
Chlamydia: NEGATIVE
Comment: NEGATIVE
Comment: NORMAL
Neisseria Gonorrhea: NEGATIVE

## 2020-03-28 LAB — STREP GP B NAA: Strep Gp B NAA: NEGATIVE

## 2020-04-09 ENCOUNTER — Other Ambulatory Visit: Payer: Self-pay

## 2020-04-09 ENCOUNTER — Encounter (HOSPITAL_COMMUNITY): Payer: Self-pay | Admitting: Obstetrics and Gynecology

## 2020-04-09 ENCOUNTER — Ambulatory Visit (INDEPENDENT_AMBULATORY_CARE_PROVIDER_SITE_OTHER): Payer: Medicare Other | Admitting: Advanced Practice Midwife

## 2020-04-09 ENCOUNTER — Inpatient Hospital Stay (EMERGENCY_DEPARTMENT_HOSPITAL)
Admission: AD | Admit: 2020-04-09 | Discharge: 2020-04-09 | Disposition: A | Discharge status: Home / Self Care | Payer: Medicare Other | Source: Home / Self Care | Attending: Obstetrics and Gynecology | Admitting: Obstetrics and Gynecology

## 2020-04-09 VITALS — BP 142/81 | HR 120

## 2020-04-09 DIAGNOSIS — Z3480 Encounter for supervision of other normal pregnancy, unspecified trimester: Secondary | ICD-10-CM

## 2020-04-09 DIAGNOSIS — Z3A38 38 weeks gestation of pregnancy: Secondary | ICD-10-CM

## 2020-04-09 DIAGNOSIS — O471 False labor at or after 37 completed weeks of gestation: Secondary | ICD-10-CM | POA: Insufficient documentation

## 2020-04-09 DIAGNOSIS — O99013 Anemia complicating pregnancy, third trimester: Secondary | ICD-10-CM

## 2020-04-09 DIAGNOSIS — D509 Iron deficiency anemia, unspecified: Secondary | ICD-10-CM

## 2020-04-09 DIAGNOSIS — R030 Elevated blood-pressure reading, without diagnosis of hypertension: Secondary | ICD-10-CM

## 2020-04-09 NOTE — Patient Instructions (Signed)
Things to Try After 37 weeks to Encourage Labor/Get Ready for Labor:   1.  Try the Miles Circuit at www.milescircuit.com daily to improve baby's position and encourage the onset of labor.  2. Walk a little and rest a little every day.  Change positions often.  3. Cervical Ripening: May try one or both a. Red Raspberry Leaf capsules or tea:  two 300mg or 400mg tablets with each meal, 2-3 times a day, or 1-3 cups of tea daily  Potential Side Effects Of Raspberry Leaf:  Most women do not experience any side effects from drinking raspberry leaf tea. However, nausea and loose stools are possible   b. Evening Primrose Oil capsules: may take 1 to 3 capsules daily. Take 1-2 capsules by mouth each day and place one capsule vaginally at night.  You may also prick the vaginal capsule to release the oil prior to inserting in the vagina. Some of the potential side effects:  Upset stomach  Loose stools or diarrhea  Headaches  Nausea  4. Sex (and especially sex with orgasm) can also help the cervix ripen and encourage labor onset.  Labor Precautions Reasons to come to [facility] at [facility]:  1.  Contractions are  5 minutes apart or less, each last 1 minute, these have been going on for 1-2 hours, and you cannot walk or talk during them 2.  You have a large gush of fluid, or a trickle of fluid that will not stop and you have to wear a pad 3.  You have bleeding that is bright red, heavier than spotting--like menstrual bleeding (spotting can be normal in early labor or after a check of your cervix) 4.  You do not feel the baby moving like he/she normally does 

## 2020-04-09 NOTE — Progress Notes (Signed)
Pt is having increase in ctx and pressure since this morning.

## 2020-04-09 NOTE — Discharge Instructions (Signed)
Braxton Hicks Contractions Contractions of the uterus can occur throughout pregnancy, but they are not always a sign that you are in labor. You may have practice contractions called Braxton Hicks contractions. These false labor contractions are sometimes confused with true labor. What are Braxton Hicks contractions? Braxton Hicks contractions are tightening movements that occur in the muscles of the uterus before labor. Unlike true labor contractions, these contractions do not result in opening (dilation) and thinning of the cervix. Toward the end of pregnancy (32-34 weeks), Braxton Hicks contractions can happen more often and may become stronger. These contractions are sometimes difficult to tell apart from true labor because they can be very uncomfortable. You should not feel embarrassed if you go to the hospital with false labor. Sometimes, the only way to tell if you are in true labor is for your health care provider to look for changes in the cervix. The health care provider will do a physical exam and may monitor your contractions. If you are not in true labor, the exam should show that your cervix is not dilating and your water has not broken. If there are no other health problems associated with your pregnancy, it is completely safe for you to be sent home with false labor. You may continue to have Braxton Hicks contractions until you go into true labor. How to tell the difference between true labor and false labor True labor  Contractions last 30-70 seconds.  Contractions become very regular.  Discomfort is usually felt in the top of the uterus, and it spreads to the lower abdomen and low back.  Contractions do not go away with walking.  Contractions usually become more intense and increase in frequency.  The cervix dilates and gets thinner. False labor  Contractions are usually shorter and not as strong as true labor contractions.  Contractions are usually irregular.  Contractions  are often felt in the front of the lower abdomen and in the groin.  Contractions may go away when you walk around or change positions while lying down.  Contractions get weaker and are shorter-lasting as time goes on.  The cervix usually does not dilate or become thin. Follow these instructions at home:   Take over-the-counter and prescription medicines only as told by your health care provider.  Keep up with your usual exercises and follow other instructions from your health care provider.  Eat and drink lightly if you think you are going into labor.  If Braxton Hicks contractions are making you uncomfortable: ? Change your position from lying down or resting to walking, or change from walking to resting. ? Sit and rest in a tub of warm water. ? Drink enough fluid to keep your urine pale yellow. Dehydration may cause these contractions. ? Do slow and deep breathing several times an hour.  Keep all follow-up prenatal visits as told by your health care provider. This is important. Contact a health care provider if:  You have a fever.  You have continuous pain in your abdomen. Get help right away if:  Your contractions become stronger, more regular, and closer together.  You have fluid leaking or gushing from your vagina.  You pass blood-tinged mucus (bloody show).  You have bleeding from your vagina.  You have low back pain that you never had before.  You feel your baby's head pushing down and causing pelvic pressure.  Your baby is not moving inside you as much as it used to. Summary  Contractions that occur before labor are   called Braxton Hicks contractions, false labor, or practice contractions. °· Braxton Hicks contractions are usually shorter, weaker, farther apart, and less regular than true labor contractions. True labor contractions usually become progressively stronger and regular, and they become more frequent. °· Manage discomfort from Braxton Hicks contractions  by changing position, resting in a warm bath, drinking plenty of water, or practicing deep breathing. °This information is not intended to replace advice given to you by your health care provider. Make sure you discuss any questions you have with your health care provider. °Document Revised: 04/23/2017 Document Reviewed: 09/24/2016 °Elsevier Patient Education © 2020 [employer] ° ° °Fetal Movement Counts ° °What is a fetal movement count? ° °A fetal movement count is the number of times that you feel your baby move during a certain amount of time. This may also be called a fetal kick count. A fetal movement count is recommended for every pregnant woman. You may be asked to start counting fetal movements as early as week 28 of your pregnancy. °Pay attention to when your baby is most active. You may notice your baby's sleep and wake cycles. You may also notice things that make your baby move more. You should do a fetal movement count: °· When your baby is normally most active. °· At the same time each day. °A good time to count movements is while you are resting, after having something to eat and drink. °How do I count fetal movements? °1. Find a quiet, comfortable area. Sit, or lie down on your side. °2. Write down the date, the start time and stop time, and the number of movements that you felt between those two times. Take this information with you to your health care visits. °3. Write down your start time when you feel the first movement. °4. Count kicks, flutters, swishes, rolls, and jabs. You should feel at least 10 movements. °5. You may stop counting after you have felt 10 movements, or if you have been counting for 2 hours. Write down the stop time. °6. If you do not feel 10 movements in 2 hours, contact your health care provider for further instructions. Your health care provider may want to do additional tests to assess your baby's well-being. °Contact a health care provider if: °· You feel fewer than 10  movements in 2 hours. °· Your baby is not moving like he or she usually does. °Date: ____________ Start time: ____________ Stop time: ____________ Movements: ____________ °Date: ____________ Start time: ____________ Stop time: ____________ Movements: ____________ °Date: ____________ Start time: ____________ Stop time: ____________ Movements: ____________ °Date: ____________ Start time: ____________ Stop time: ____________ Movements: ____________ °Date: ____________ Start time: ____________ Stop time: ____________ Movements: ____________ °Date: ____________ Start time: ____________ Stop time: ____________ Movements: ____________ °Date: ____________ Start time: ____________ Stop time: ____________ Movements: ____________ °Date: ____________ Start time: ____________ Stop time: ____________ Movements: ____________ °Date: ____________ Start time: ____________ Stop time: ____________ Movements: ____________ °This information is not intended to replace advice given to you by your health care provider. Make sure you discuss any questions you have with your health care provider. °Document Revised: 12/29/2018 Document Reviewed: 12/29/2018 °Elsevier Patient Education © 2020 Elsevier Inc. ° °

## 2020-04-09 NOTE — MAU Note (Signed)
.  Michelle Freeman is a 34 y.o. at [redacted]w[redacted]d here in MAU reporting: Ctx that started this morning around 0900. She states no VB or LOF. Was 1cm in the office today. Reports decreased fetal movement but states she felt baby move on the way here. GBS neg.  * Pain score: 5  FHT:154

## 2020-04-09 NOTE — Progress Notes (Signed)
   PRENATAL VISIT NOTE  Subjective:  Michelle Freeman is a 34 y.o. [phone] at [redacted]w[redacted]d being seen today for ongoing prenatal care.  She is currently monitored for the following issues for this low-risk pregnancy and has Obesity; Supervision of other normal pregnancy, antepartum; Abnormal genetic test; Maternal iron deficiency anemia complicating pregnancy, third trimester; and [redacted] weeks gestation of pregnancy on their problem list.  Patient reports regular painful contractions.  Contractions: Irregular. Vag. Bleeding: None.  Movement: Present. Denies leaking of fluid.   The following portions of the patient's history were reviewed and updated as appropriate: allergies, current medications, past family history, past medical history, past social history, past surgical history and problem list.   Objective:   Vitals:   04/09/20 1341  BP: (!) 142/81  Pulse: (!) 120    Fetal Status: Fetal Heart Rate (bpm): 150 Fundal Height: 38 cm Movement: Present  Presentation: Vertex  General:  Alert, oriented and cooperative. Patient is in no acute distress.  Skin: Skin is warm and dry. No rash noted.   Cardiovascular: Normal heart rate noted  Respiratory: Normal respiratory effort, no problems with respiration noted  Abdomen: Soft, gravid, appropriate for gestational age.  Pain/Pressure: Present     Pelvic: Cervical exam performed in the presence of a chaperone Dilation: 1 Effacement (%): 50 Station: -2  Extremities: Normal range of motion.     Mental Status: Normal mood and affect. Normal behavior. Normal judgment and thought content.   Assessment and Plan:  Pregnancy: [id number] at [redacted]w[redacted]d 1. Supervision of other normal pregnancy, antepartum --Anticipatory guidance about next visits/weeks of pregnancy given. --Pt breathing through contractions in the office today, reports they will get 5 minutes apart, then space back out at home. No leaking fluid or vaginal bleeding. Baby is moving well. --Cervix 1/50/-2,  vertex today on exam. --Labor precautions reviewed, pt desires to go home but may stay in [place] a little while to see if contractions get stronger --BP elevated today for the first time, but pt in pain in the office.  PEC labs drawn and pt scheduled to return to office for BP check tomorrow. PEC precautions given. --Pt aware and agrees with plan that HTN diagnosis will prevent her from having a WB as the baby will need to be monitored continuously. She desires natural labor and we discussed ways we can support her even if continuous EFM is required.  2. Maternal iron deficiency anemia complicating pregnancy, third trimester --Taking oral iron and PNV  3. [redacted] weeks gestation of pregnancy   Term labor symptoms and general obstetric precautions including but not limited to vaginal bleeding, contractions, leaking of fluid and fetal movement were reviewed in detail with the patient. Please refer to After Visit Summary for other counseling recommendations.   Return in about 1 week (around 04/16/2020).  Future Appointments  Date Time Provider Department Center  04/10/2020  9:00 AM [facility] NURSE [facility] None    Sharen Counter, [place]

## 2020-04-09 NOTE — MAU Provider Note (Signed)
S: Ms. Michelle Freeman is a 34 y.o. [id number] at [redacted]w[redacted]d  who presents to MAU today for labor evaluation.   Cervical exam by RN:  Dilation: 1.5 Effacement (%): 50 Cervical Position: Posterior Exam by:: Caprice Renshaw, RN  Fetal Monitoring: Baseline: 140 Variability: moderate  Accelerations: multiple, reactive strip Decelerations: none Contractions: irregular  MDM Discussed patient with RN. NST reviewed.   A: SIUP at [redacted]w[redacted]d. Vital signs stable. False labor  P: Discharge home Labor precautions and kick counts included in AVS Patient to follow-up with [facility] on 11/17 as scheduled  Patient may return to MAU as needed or when in labor   Ilianna Bown, Skipper Cliche, MD OB Fellow, Faculty [facility] 04/09/2020 10:47 PM

## 2020-04-10 ENCOUNTER — Inpatient Hospital Stay (HOSPITAL_COMMUNITY)
Admission: AD | Admit: 2020-04-10 | Discharge: 2020-04-11 | DRG: 807 | Disposition: A | Discharge status: Home / Self Care | Payer: Medicare Other | Attending: Obstetrics and Gynecology | Admitting: Obstetrics and Gynecology

## 2020-04-10 ENCOUNTER — Ambulatory Visit: Payer: Medicare Other

## 2020-04-10 ENCOUNTER — Encounter (HOSPITAL_COMMUNITY): Payer: Self-pay | Admitting: Obstetrics and Gynecology

## 2020-04-10 DIAGNOSIS — Z87891 Personal history of nicotine dependence: Secondary | ICD-10-CM

## 2020-04-10 DIAGNOSIS — D509 Iron deficiency anemia, unspecified: Secondary | ICD-10-CM | POA: Diagnosis present

## 2020-04-10 DIAGNOSIS — R7401 Elevation of levels of liver transaminase levels: Secondary | ICD-10-CM

## 2020-04-10 DIAGNOSIS — Z3480 Encounter for supervision of other normal pregnancy, unspecified trimester: Secondary | ICD-10-CM

## 2020-04-10 DIAGNOSIS — R898 Other abnormal findings in specimens from other organs, systems and tissues: Secondary | ICD-10-CM | POA: Diagnosis present

## 2020-04-10 DIAGNOSIS — E669 Obesity, unspecified: Secondary | ICD-10-CM | POA: Diagnosis present

## 2020-04-10 DIAGNOSIS — O134 Gestational [pregnancy-induced] hypertension without significant proteinuria, complicating childbirth: Secondary | ICD-10-CM | POA: Diagnosis present

## 2020-04-10 DIAGNOSIS — Z3A38 38 weeks gestation of pregnancy: Secondary | ICD-10-CM

## 2020-04-10 DIAGNOSIS — O99214 Obesity complicating childbirth: Secondary | ICD-10-CM | POA: Diagnosis present

## 2020-04-10 DIAGNOSIS — O9902 Anemia complicating childbirth: Secondary | ICD-10-CM | POA: Diagnosis present

## 2020-04-10 DIAGNOSIS — O26893 Other specified pregnancy related conditions, third trimester: Secondary | ICD-10-CM | POA: Diagnosis present

## 2020-04-10 DIAGNOSIS — Z9889 Other specified postprocedural states: Secondary | ICD-10-CM | POA: Diagnosis not present

## 2020-04-10 DIAGNOSIS — Z20822 Contact with and (suspected) exposure to covid-19: Secondary | ICD-10-CM | POA: Diagnosis present

## 2020-04-10 DIAGNOSIS — O99013 Anemia complicating pregnancy, third trimester: Secondary | ICD-10-CM

## 2020-04-10 DIAGNOSIS — O139 Gestational [pregnancy-induced] hypertension without significant proteinuria, unspecified trimester: Secondary | ICD-10-CM

## 2020-04-10 LAB — COMPREHENSIVE METABOLIC PANEL
ALT: 15 IU/L (ref 0–32)
ALT: 23 U/L (ref 0–44)
AST: 22 IU/L (ref 0–40)
AST: 48 U/L — ABNORMAL HIGH (ref 15–41)
Albumin/Globulin Ratio: 1.6 (ref 1.2–2.2)
Albumin: 3.7 g/dL — ABNORMAL LOW (ref 3.8–4.8)
Albumin: 2.7 g/dL — ABNORMAL LOW (ref 3.5–5.0)
Alkaline Phosphatase: 168 IU/L — ABNORMAL HIGH (ref 44–121)
Alkaline Phosphatase: 139 U/L — ABNORMAL HIGH (ref 38–126)
Anion gap: 11 (ref 5–15)
BUN/Creatinine Ratio: 6 — ABNORMAL LOW (ref 9–23)
BUN: 4 mg/dL — ABNORMAL LOW (ref 6–20)
BUN: 6 mg/dL (ref 6–20)
Bilirubin Total: 0.4 mg/dL (ref 0.0–1.2)
CO2: 19 mmol/L — ABNORMAL LOW (ref 20–29)
CO2: 18 mmol/L — ABNORMAL LOW (ref 22–32)
Calcium: 8.9 mg/dL (ref 8.7–10.2)
Calcium: 8.8 mg/dL — ABNORMAL LOW (ref 8.9–10.3)
Chloride: 104 mmol/L (ref 96–106)
Chloride: 103 mmol/L (ref 98–111)
Creatinine, Ser: 0.69 mg/dL (ref 0.57–1.00)
Creatinine, Ser: 0.7 mg/dL (ref 0.44–1.00)
GFR calc Af Amer: 130 mL/min/{1.73_m2} (ref >59)
GFR calc non Af Amer: 112 mL/min/{1.73_m2} (ref >59)
GFR, Estimated: >60 mL/min (ref >60)
Globulin, Total: 2.3 g/dL (ref 1.5–4.5)
Glucose, Bld: 104 mg/dL — ABNORMAL HIGH (ref 70–99)
Glucose: 107 mg/dL — ABNORMAL HIGH (ref 65–99)
Potassium: 4.2 mmol/L (ref 3.5–5.2)
Potassium: 4.8 mmol/L (ref 3.5–5.1)
Sodium: 136 mmol/L (ref 134–144)
Sodium: 132 mmol/L — ABNORMAL LOW (ref 135–145)
Total Bilirubin: 1 mg/dL (ref 0.3–1.2)
Total Protein: 6 g/dL (ref 6.0–8.5)
Total Protein: 5.7 g/dL — ABNORMAL LOW (ref 6.5–8.1)

## 2020-04-10 LAB — RESPIRATORY PANEL BY RT PCR (FLU A&B, COVID)
Influenza A by PCR: NEGATIVE
Influenza B by PCR: NEGATIVE
SARS Coronavirus 2 by RT PCR: NEGATIVE

## 2020-04-10 LAB — CBC
HCT: 29.7 % — ABNORMAL LOW (ref 36.0–46.0)
Hematocrit: 29 % — ABNORMAL LOW (ref 34.0–46.6)
Hemoglobin: 9.3 g/dL — ABNORMAL LOW (ref 11.1–15.9)
Hemoglobin: 8.9 g/dL — ABNORMAL LOW (ref 12.0–15.0)
MCH: 23.7 pg — ABNORMAL LOW (ref 26.6–33.0)
MCH: 23.2 pg — ABNORMAL LOW (ref 26.0–34.0)
MCHC: 32.1 g/dL (ref 31.5–35.7)
MCHC: 30 g/dL (ref 30.0–36.0)
MCV: 74 fL — ABNORMAL LOW (ref 79–97)
MCV: 77.3 fL — ABNORMAL LOW (ref 80.0–100.0)
Platelets: 323 10*3/uL (ref 150–450)
Platelets: 291 10*3/uL (ref 150–400)
RBC: 3.93 x10E6/uL (ref 3.77–5.28)
RBC: 3.84 MIL/uL — ABNORMAL LOW (ref 3.87–5.11)
RDW: 16.6 % — ABNORMAL HIGH (ref 11.7–15.4)
RDW: 17.6 % — ABNORMAL HIGH (ref 11.5–15.5)
WBC: 13.9 10*3/uL — ABNORMAL HIGH (ref 3.4–10.8)
WBC: 16 10*3/uL — ABNORMAL HIGH (ref 4.0–10.5)
nRBC: 0.4 % — ABNORMAL HIGH (ref 0.0–0.2)

## 2020-04-10 LAB — TYPE AND SCREEN
ABO/RH(D): B POS
Antibody Screen: NEGATIVE

## 2020-04-10 LAB — RPR: RPR Ser Ql: NONREACTIVE

## 2020-04-10 LAB — HIV ANTIBODY (ROUTINE TESTING W REFLEX): HIV Screen 4th Generation wRfx: NONREACTIVE

## 2020-04-10 MED ORDER — IBUPROFEN 600 MG PO TABS
600.0000 mg | ORAL_TABLET | Freq: Four times a day (QID) | ORAL | Status: DC
  Administered 2020-04-10 – 2020-04-11 (×3): 600 mg via ORAL
  Filled 2020-04-10 (×4): qty 1

## 2020-04-10 MED ORDER — DIPHENHYDRAMINE HCL 25 MG PO CAPS: 25.0000 mg | ORAL_CAPSULE | Freq: Four times a day (QID) | ORAL | Status: DC | PRN

## 2020-04-10 MED ORDER — AMLODIPINE BESYLATE 5 MG PO TABS
5.0000 mg | ORAL_TABLET | Freq: Every day | ORAL | Status: DC
  Administered 2020-04-10 – 2020-04-11 (×2): 5 mg via ORAL
  Filled 2020-04-10 (×2): qty 1

## 2020-04-10 MED ORDER — SENNOSIDES-DOCUSATE SODIUM 8.6-50 MG PO TABS
2.0000 | ORAL_TABLET | ORAL | Status: DC
  Administered 2020-04-10: 2 via ORAL
  Filled 2020-04-10: qty 2

## 2020-04-10 MED ORDER — LACTATED RINGERS IV SOLN: 500.0000 mL | INTRAVENOUS | Status: DC | PRN

## 2020-04-10 MED ORDER — ONDANSETRON HCL 4 MG/2ML IJ SOLN: 4.0000 mg | Freq: Four times a day (QID) | INTRAMUSCULAR | Status: DC | PRN

## 2020-04-10 MED ORDER — DIBUCAINE (PERIANAL) 1 % EX OINT: 1.0000 "application " | TOPICAL_OINTMENT | CUTANEOUS | Status: DC | PRN

## 2020-04-10 MED ORDER — FENTANYL CITRATE (PF) 100 MCG/2ML IJ SOLN
100.0000 ug | INTRAMUSCULAR | Status: DC | PRN
  Administered 2020-04-10: 100 ug via INTRAVENOUS
  Filled 2020-04-10: qty 2

## 2020-04-10 MED ORDER — ONDANSETRON HCL 4 MG PO TABS: 4.0000 mg | ORAL_TABLET | ORAL | Status: DC | PRN

## 2020-04-10 MED ORDER — ONDANSETRON HCL 4 MG/2ML IJ SOLN: 4.0000 mg | INTRAMUSCULAR | Status: DC | PRN

## 2020-04-10 MED ORDER — OXYTOCIN BOLUS FROM INFUSION
333.0000 mL | Freq: Once | INTRAVENOUS | Status: AC
  Administered 2020-04-10: 333 mL via INTRAVENOUS

## 2020-04-10 MED ORDER — LACTATED RINGERS IV SOLN: INTRAVENOUS | Status: DC

## 2020-04-10 MED ORDER — TETANUS-DIPHTH-ACELL PERTUSSIS 5-2.5-18.5 LF-MCG/0.5 IM SUSY: 0.5000 mL | PREFILLED_SYRINGE | Freq: Once | INTRAMUSCULAR | Status: DC

## 2020-04-10 MED ORDER — BENZOCAINE-MENTHOL 20-0.5 % EX AERO: 1.0000 "application " | INHALATION_SPRAY | CUTANEOUS | Status: DC | PRN

## 2020-04-10 MED ORDER — WITCH HAZEL-GLYCERIN EX PADS: 1.0000 "application " | MEDICATED_PAD | CUTANEOUS | Status: DC | PRN

## 2020-04-10 MED ORDER — COCONUT OIL OIL: 1.0000 "application " | TOPICAL_OIL | Status: DC | PRN

## 2020-04-10 MED ORDER — LIDOCAINE HCL (PF) 1 % IJ SOLN: 30.0000 mL | INTRAMUSCULAR | Status: DC | PRN

## 2020-04-10 MED ORDER — SIMETHICONE 80 MG PO CHEW: 80.0000 mg | CHEWABLE_TABLET | ORAL | Status: DC | PRN

## 2020-04-10 MED ORDER — FERROUS SULFATE 325 (65 FE) MG PO TABS
325.0000 mg | ORAL_TABLET | ORAL | Status: DC
  Administered 2020-04-10: 325 mg via ORAL
  Filled 2020-04-10: qty 1

## 2020-04-10 MED ORDER — OXYCODONE-ACETAMINOPHEN 5-325 MG PO TABS: 1.0000 | ORAL_TABLET | ORAL | Status: DC | PRN

## 2020-04-10 MED ORDER — ACETAMINOPHEN 325 MG PO TABS: 650.0000 mg | ORAL_TABLET | ORAL | Status: DC | PRN

## 2020-04-10 MED ORDER — PRENATAL MULTIVITAMIN CH
1.0000 | ORAL_TABLET | Freq: Every day | ORAL | Status: DC
  Administered 2020-04-10: 1 via ORAL
  Filled 2020-04-10: qty 1

## 2020-04-10 MED ORDER — MORPHINE SULFATE (PF) 4 MG/ML IV SOLN
2.0000 mg | Freq: Once | INTRAVENOUS | Status: DC
  Filled 2020-04-10: qty 1

## 2020-04-10 MED ORDER — ACETAMINOPHEN 325 MG PO TABS
650.0000 mg | ORAL_TABLET | Freq: Four times a day (QID) | ORAL | Status: DC
  Administered 2020-04-10 – 2020-04-11 (×3): 650 mg via ORAL
  Filled 2020-04-10 (×4): qty 2

## 2020-04-10 MED ORDER — SOD CITRATE-CITRIC ACID 500-334 MG/5ML PO SOLN: 30.0000 mL | ORAL | Status: DC | PRN

## 2020-04-10 MED ORDER — OXYCODONE-ACETAMINOPHEN 5-325 MG PO TABS: 2.0000 | ORAL_TABLET | ORAL | Status: DC | PRN

## 2020-04-10 MED ORDER — OXYTOCIN-SODIUM CHLORIDE 30-0.9 UT/500ML-% IV SOLN
2.5000 [IU]/h | INTRAVENOUS | Status: DC
  Administered 2020-04-10: 2.5 [IU]/h via INTRAVENOUS
  Filled 2020-04-10: qty 500

## 2020-04-10 NOTE — MAU Note (Signed)
Pt having ctx every minute.

## 2020-04-10 NOTE — Discharge Instructions (Signed)

## 2020-04-10 NOTE — Discharge Summary (Signed)
Postpartum Discharge Summary    Patient Name: Michelle Freeman DOB: 10-06-1985 MRN: [id number]  Date of admission: 04/10/2020 Delivery date:04/10/2020  Delivering provider: Randa Ngo  Date of discharge: 04/11/2020  Admitting diagnosis: Supervision of low-risk pregnancy, third trimester [Z34.93] Intrauterine pregnancy: [redacted]w[redacted]d    Secondary diagnosis:  Principal Problem:   Vaginal delivery Active Problems:   Supervision of other normal pregnancy, antepartum   Abnormal genetic test   Maternal iron deficiency anemia complicating pregnancy, third trimester  Additional problems: as noted above   Discharge diagnosis: Vaginal delivery                                             Post partum procedures:none Augmentation: none Complications: None  Hospital course: Onset of Labor With Vaginal Delivery      34y.o. yo [id number] admitted in Latent Labor on 04/10/2020. Patient had an uncomplicated labor course as follows:  Membrane Rupture Time/Date: 6:35 AM ,04/10/2020   Delivery Method:Vaginal, Spontaneous  Episiotomy: None  Lacerations:  None  Patient had an uncomplicated postpartum course.  She is ambulating, tolerating a regular diet, passing flatus, and urinating well. Patient is discharged home in stable condition on 04/11/20.  Newborn Data: Birth date:04/10/2020  Birth time:6:36 AM  Gender:Female  Living status:Living  Apgars:8 ,9  Weight:2585 g   Magnesium Sulfate received: No BMZ received: No Rhophylac:N/A MMR:N/A T-DaP: declined Flu: declined Transfusion:No  Physical exam  Vitals:   04/10/20 1330 04/10/20 1800 04/10/20 2147 04/11/20 0522  BP: 114/77 124/71 136/76 118/78  Pulse: 87 94 (!) 126 (!) 105  Resp: _0 Temp: 97.7 F (36.5 C) 98.4 F (36.9 C) 98.4 F (36.9 C) 98.4 F (36.9 C)  TempSrc: Oral Oral Oral Oral   General: alert, cooperative and no distress Lochia: appropriate Uterine Fundus: firm Incision: N/A DVT Evaluation:  No evidence of DVT seen on physical exam. No cords or calf tenderness. Trace LE edema bilaterally. Labs: Lab Results  Component Value Date   WBC 16.0 (H) 04/10/2020   HGB 8.9 (L) 04/10/2020   HCT 29.7 (L) 04/10/2020   MCV 77.3 (L) 04/10/2020   PLT 291 04/10/2020   CMP Latest Ref Rng & Units 04/10/2020  Glucose 70 - 99 mg/dL 104(H)  BUN 6 - 20 mg/dL 6  Creatinine 0.44 - 1.00 mg/dL 0.70  Sodium 135 - 145 mmol/L 132(L)  Potassium 3.5 - 5.1 mmol/L 4.8  Chloride 98 - 111 mmol/L 103  CO2 22 - 32 mmol/L 18(L)  Calcium 8.9 - 10.3 mg/dL 8.8(L)  Total Protein 6.5 - 8.1 g/dL 5.7(L)  Total Bilirubin 0.3 - 1.2 mg/dL 1.0  Alkaline Phos 38 - 126 U/L 139(H)  AST 15 - 41 U/L 48(H)  ALT 0 - 44 U/L 23   Edinburgh Score: No flowsheet data found.   After visit meds:  Allergies as of 04/11/2020   No Known Allergies     Medication List    STOP taking these medications   BODY/HAIR/SKIN/NAILS PO   Vitamin D (Ergocalciferol) 1.25 MG (50000 UNIT) Caps capsule Commonly known as: DRISDOL     TAKE these medications   acetaminophen 325 MG tablet Commonly known as: Tylenol Take 2 tablets (650 mg total) by mouth every 6 (six) hours as needed.   amLODipine 5 MG tablet Commonly known as: NORVASC Take 1 tablet (  5 mg total) by mouth daily. Start taking on: April 12, 2020   Blood Pressure Kit Devi 1 kit by Does not apply route once a week. Check Blood Pressure regularly and record readings into the Babyscripts App.  Large Cuff.  DX O90.0   coconut oil Oil Apply 1 application topically as needed (nipple pain).   ferrous sulfate 325 (65 FE) MG tablet Take 1 tablet (325 mg total) by mouth every other day. Start taking on: April 12, 2020 What changed: when to take this   ibuprofen 800 MG tablet Commonly known as: ADVIL Take 1 tablet (800 mg total) by mouth every 8 (eight) hours as needed for fever, headache, mild pain or moderate pain.   oxyCODONE 5 MG immediate release  tablet Commonly known as: Roxicodone Take 1 tablet (5 mg total) by mouth every 6 (six) hours as needed for severe pain or breakthrough pain.   prenatal multivitamin Tabs tablet Take 1 tablet by mouth daily at 12 noon.      Discharge home in stable condition Infant Feeding: Bottle Infant Disposition:home with mother Discharge instruction: per After Visit Summary and Postpartum booklet. Activity: Advance as tolerated. Pelvic rest for 6 weeks.  Diet: routine diet Future Appointments: Future Appointments  Date Time Provider [place]  04/17/2020 10:20 AM [place] None  05/14/2020 11:00 AM Cephas Darby, MD [place] None   Follow up Visit: Message sent to [facility] on 04/10/20.   Please schedule this patient for a In person postpartum visit in 4 weeks with the following provider: Any provider. Additional Postpartum F/U:BP check 1 week  Low risk pregnancy complicated by: anemia, gHTN (diagnosed intrapartum)--discharged on norvasc 25m daily. Elevated AST (48) postpartum. Delivery mode:  Vaginal, Spontaneous  Anticipated Birth Control:  Declined s/p counseling.  GRanda Ngo MD OB Fellow, Faculty Practice 04/11/2020 6:45 AM

## 2020-04-10 NOTE — H&P (Signed)
OBSTETRIC ADMISSION HISTORY AND PHYSICAL  Michelle Freeman is a 34 y.o. female [phone] with IUP at [redacted]w[redacted]d by LMP presenting for spontaneous onset of labor. She reports +FMs, No LOF, no VB, no blurry vision, headaches or peripheral edema, and RUQ pain.  She plans on bottle feeding. She is undecided for birth control.  She received her prenatal care at [facility]: By LMP --->  Estimated Date of Delivery: 04/21/20  Sono:  $Remo'@[redacted]w[redacted]d'euOBw$ , CWD, normal anatomy, cephalic presentation, 629B, 21% EFW  Prenatal History/Complications:  -Anemia (no iron in prenatal period)  Past Medical History: Past Medical History:  Diagnosis Date  . Asthma   . Vitamin D deficiency     Past Surgical History: Past Surgical History:  Procedure Laterality Date  . NO PAST SURGERIES      Obstetrical History: OB History    Gravida  5   Para  2   Term  2   Preterm      AB  2   Living  2     SAB      TAB      Ectopic      Multiple      Live Births  2           Social History Social History   Socioeconomic History  . Marital status: Single    Spouse name: Not on file  . Number of children: Not on file  . Years of education: Not on file  . Highest education level: Not on file  Occupational History  . Not on file  Tobacco Use  . Smoking status: Former Smoker    Packs/day: 0.50    Types: Cigarettes  . Smokeless tobacco: Former Systems developer    Quit date: 05/25/2015  Vaping Use  . Vaping Use: Never used  Substance and Sexual Activity  . Alcohol use: Not Currently    Alcohol/week: 0.0 standard drinks  . Drug use: Not Currently    Types: Marijuana  . Sexual activity: Yes    Partners: Male    Birth control/protection: Abstinence, None  Other Topics Concern  . Not on file  Social History Narrative  . Not on file   Social Determinants of Health   Financial Resource Strain:   . Difficulty of Paying Living Expenses: Not on file  Food Insecurity:   . Worried About Charity fundraiser  in the Last Year: Not on file  . Ran Out of Food in the Last Year: Not on file  Transportation Needs:   . Lack of Transportation (Medical): Not on file  . Lack of Transportation (Non-Medical): Not on file  Physical Activity:   . Days of Exercise per Week: Not on file  . Minutes of Exercise per Session: Not on file  Stress:   . Feeling of Stress : Not on file  Social Connections:   . Frequency of Communication with Friends and Family: Not on file  . Frequency of Social Gatherings with Friends and Family: Not on file  . Attends Religious Services: Not on file  . Active Member of Clubs or Organizations: Not on file  . Attends Archivist Meetings: Not on file  . Marital Status: Not on file    Family History: Family History  Problem Relation Age of Onset  . Diabetes Mother   . Obesity Mother   . Mental illness Mother     Allergies: No Known Allergies  Medications Prior to Admission  Medication Sig Dispense Refill  Last Dose  . Blood Pressure Monitoring (BLOOD PRESSURE KIT) DEVI 1 kit by Does not apply route once a week. Check Blood Pressure regularly and record readings into the Babyscripts App.  Large Cuff.  DX O90.0 1 each 0   . ferrous sulfate (FERROUSUL) 325 (65 FE) MG tablet Take 1 tablet (325 mg total) by mouth daily. 30 tablet 5   . Multiple Vitamins-Minerals (BODY/HAIR/SKIN/NAILS PO) Take by mouth. (Patient not taking: Reported on 03/26/2020)     . Vitamin D, Ergocalciferol, (DRISDOL) 50000 units CAPS capsule Take 50,000 Units by mouth every 7 (seven) days.  (Patient not taking: Reported on 03/12/2020)        Review of Systems  All systems reviewed and negative except as stated in HPI  Last menstrual period 07/16/2019. General appearance: alert, cooperative and appears stated age Lungs: normal WOB Heart: regular rate Abdomen: soft, non-tender Extremities: no sign of DVT Presentation: cephalic Fetal monitoringBaseline: 125 bpm, Variability: Good {> 6 bpm),  Accelerations: Reactive and Decelerations: Absent Uterine activity: every 2-4 minutes Dilation: 3 Effacement (%): 90 Station: -3 Exam by:: Carmelia Roller CNM  Prenatal labs: ABO, Rh: B/Positive/-- (05/12 1118) Antibody: Negative (05/12 1118) Rubella: 2.86 (05/12 1118) RPR: Non Reactive (10/26 1131)  HBsAg: Negative (05/12 1118)  HIV: Non Reactive (10/26 1131)  GBS: Negative/-- (11/02 0258)  2 hr Glucola wnl Genetic screening wnl Anatomy US: wnl  Prenatal Transfer Tool  Maternal Diabetes: No Genetic Screening: Normal Maternal Ultrasounds/Referrals: Normal Fetal Ultrasounds or other Referrals:  None Maternal Substance Abuse:  No Significant Maternal Medications:  None Significant Maternal Lab Results: Group B Strep negative and 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  Results for orders placed or performed in visit on 04/09/20 (from the past 24 hour(s))  CBC   Collection Time: 04/09/20  2:16 PM  Result Value Ref Range   WBC 13.9 (H) 3.4 - 10.8 x10E3/uL   RBC 3.93 3.77 - 5.28 x10E6/uL   Hemoglobin 9.3 (L) 11.1 - 15.9 g/dL   Hematocrit 29.0 (L) 34.0 - 46.6 %   MCV 74 (L) 79 - 97 fL   MCH 23.7 (L) 26.6 - 33.0 pg   MCHC 32.1 31 - 35 g/dL   RDW 16.6 (H) 11.7 - 15.4 %   Platelets 323 150 - 450 x10E3/uL  Comp Met (CMET)   Collection Time: 04/09/20  2:16 PM  Result Value Ref Range   Glucose 107 (H) 65 - 99 mg/dL   BUN 4 (L) 6 - 20 mg/dL   Creatinine, Ser 0.69 0.57 - 1.00 mg/dL   GFR calc non Af Amer 112 >59 mL/min/1.73   GFR calc Af Amer 130 >59 mL/min/1.73   BUN/Creatinine Ratio 6 (L) 9 - 23   Sodium 136 134 - 144 mmol/L   Potassium 4.2 3.5 - 5.2 mmol/L   Chloride 104 96 - 106 mmol/L   CO2 19 (L) 20 - 29 mmol/L   Calcium 8.9 8.7 - 10.2 mg/dL   Total Protein 6.0 6.0 - 8.5 g/dL   Albumin 3.7 (L) 3.8 - 4.8 g/dL   Globulin, Total 2.3 1.5 - 4.5 g/dL   Albumin/Globulin Ratio 1.6 1.2 - 2.2   Bilirubin Total 0.4 0.0 - 1.2 mg/dL   Alkaline Phosphatase 168 (H) 44 - 121 IU/L    AST 22 0 - 40 IU/L   ALT 15 0 - 32 IU/L    Patient Active Problem List   Diagnosis Date Noted  . [redacted] weeks gestation of pregnancy 03/26/2020  .  Maternal iron deficiency anemia complicating pregnancy, third trimester 03/20/2020  . Abnormal genetic test 10/17/2019  . Supervision of other normal pregnancy, antepartum 09/04/2019  . Obesity 06/28/2013    Assessment/Plan:  Michelle Freeman is a 34 y.o. [id number] at [redacted]w[redacted]d here for spontaneous onset of labor.  #Labor: Will manage expectantly. 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 No po iron or IV iron in prenatal period. Consider supplementation s/p delivery.  Randa Ngo, MD  04/10/2020, 5:39 AM

## 2020-04-11 ENCOUNTER — Other Ambulatory Visit (HOSPITAL_COMMUNITY): Payer: Self-pay | Admitting: Obstetrics and Gynecology

## 2020-04-11 DIAGNOSIS — R7401 Elevation of levels of liver transaminase levels: Secondary | ICD-10-CM

## 2020-04-11 DIAGNOSIS — Z9889 Other specified postprocedural states: Secondary | ICD-10-CM | POA: Diagnosis not present

## 2020-04-11 MED ORDER — PRENATAL MULTIVITAMIN CH: 1.0000 | ORAL_TABLET | Freq: Every day | ORAL | Status: AC

## 2020-04-11 MED ORDER — IBUPROFEN 800 MG PO TABS: 800.0000 mg | ORAL_TABLET | Freq: Three times a day (TID) | ORAL | 0 refills | Status: DC | PRN

## 2020-04-11 MED ORDER — OXYCODONE HCL 5 MG PO TABS
5.0000 mg | ORAL_TABLET | Freq: Four times a day (QID) | ORAL | 0 refills | Status: DC | PRN
Start: 2020-04-11 — End: 2020-04-11

## 2020-04-11 MED ORDER — FERROUS SULFATE 325 (65 FE) MG PO TABS
325.0000 mg | ORAL_TABLET | ORAL | 0 refills | Status: DC
Start: 2020-04-12 — End: 2020-04-12

## 2020-04-11 MED ORDER — AMLODIPINE BESYLATE 5 MG PO TABS
5.0000 mg | ORAL_TABLET | Freq: Every day | ORAL | 0 refills | Status: DC
Start: 2020-04-12 — End: 2020-04-12

## 2020-04-11 MED ORDER — ACETAMINOPHEN 325 MG PO TABS: 650.0000 mg | ORAL_TABLET | Freq: Four times a day (QID) | ORAL | Status: AC | PRN

## 2020-04-11 MED ORDER — COCONUT OIL OIL: 1.0000 "application " | TOPICAL_OIL | 0 refills | Status: AC | PRN

## 2020-04-11 MED ORDER — OXYCODONE HCL 5 MG PO TABS
5.0000 mg | ORAL_TABLET | Freq: Once | ORAL | Status: AC
  Administered 2020-04-11: 5 mg via ORAL
  Filled 2020-04-11: qty 1

## 2020-04-11 MED FILL — AMLODIPINE BESYLATE 5 MG TA: 5 | 45 days supply | Qty: 45 | Fill #0

## 2020-04-11 MED FILL — IBUPROFEN 800 MG TAB: 800 | 10 days supply | Qty: 30 | Fill #0

## 2020-04-11 MED FILL — FERROUS SULFATE 325 MG TAB: 325 (65 FE) | 60 days supply | Qty: 30 | Fill #0

## 2020-04-11 MED FILL — oxyCODONE HCL 5 MG TABS: 5 | 2 days supply | Qty: 10 | Fill #0

## 2020-04-11 NOTE — Progress Notes (Signed)
Was informed by lab that patient refused her AM labs. She said she did not understand why she needed blood work. Lab told her it was a hepatic function test, to which she replied that she had questions and would refuse until explained to her. I saw resident rounding and informed her. She said she was rounding on assigned rooms and 410 was not assigned to her but that would let whoever was going to round on [place] know so they can talk to her more in depth about it. I briefly talked to her about it and she said it does not matter the reason behind the test, that she was still going to refused getting blood work.

## 2020-04-16 ENCOUNTER — Encounter: Payer: Medicare Other | Admitting: Certified Nurse Midwife

## 2020-04-17 ENCOUNTER — Other Ambulatory Visit: Payer: Self-pay

## 2020-04-17 ENCOUNTER — Ambulatory Visit: Payer: Medicare Other

## 2020-04-17 VITALS — BP 120/85 | HR 82

## 2020-04-17 DIAGNOSIS — Z0130 Encounter for examination of blood pressure without abnormal findings: Secondary | ICD-10-CM

## 2020-04-17 NOTE — Progress Notes (Signed)
Subjective:  Michelle Freeman is a 34 y.o. female here for BP check.   Hypertension TIR:WERXVQ medications as instructed,no medication side effects noted,no TIA's, no chest pain on exertion, no dyspnea on exertion","no swelling of ankles .    Objective:  Blood Pressure: 120/85 P 82 Appearance alert, well appearing, and in no distress General exam BP noted to be well controlled today in office.    Assessment:   Blood Pressure 125/85 P82  Plan:  Continue to monitor your blood pressure and take  Medications as directed.

## 2020-05-14 ENCOUNTER — Ambulatory Visit: Payer: Medicare Other | Admitting: Obstetrics and Gynecology

## 2020-06-26 ENCOUNTER — Other Ambulatory Visit: Payer: Self-pay | Admitting: Internal Medicine

## 2020-06-29 LAB — IRON, TOTAL/TOTAL IRON BINDING CAP
%SAT: 9 % (calc) — ABNORMAL LOW (ref 16–45)
Iron: 45 ug/dL (ref 40–190)
TIBC: 488 mcg/dL (calc) — ABNORMAL HIGH (ref 250–450)

## 2020-06-29 LAB — LIPID PANEL
Cholesterol: 194 mg/dL (ref <200)
HDL: 84 mg/dL (ref >=50)
LDL Cholesterol (Calc): 93 mg/dL (calc)
Non-HDL Cholesterol (Calc): 110 mg/dL (calc) (ref <130)
Total CHOL/HDL Ratio: 2.3 (calc) (ref <5.0)
Triglycerides: 76 mg/dL (ref <150)

## 2020-06-29 LAB — CBC
HCT: 31.6 % — ABNORMAL LOW (ref 35.0–45.0)
Hemoglobin: 9.9 g/dL — ABNORMAL LOW (ref 11.7–15.5)
MCH: 23.7 pg — ABNORMAL LOW (ref 27.0–33.0)
MCHC: 31.3 g/dL — ABNORMAL LOW (ref 32.0–36.0)
MCV: 75.8 fL — ABNORMAL LOW (ref 80.0–100.0)
MPV: 10.2 fL (ref 7.5–12.5)
Platelets: 289 10*3/uL (ref 140–400)
RBC: 4.17 10*6/uL (ref 3.80–5.10)
RDW: 18.7 % — ABNORMAL HIGH (ref 11.0–15.0)
WBC: 4.7 10*3/uL (ref 3.8–10.8)

## 2020-06-29 LAB — VITAMIN D 25 HYDROXY (VIT D DEFICIENCY, FRACTURES): Vit D, 25-Hydroxy: 14 ng/mL — ABNORMAL LOW (ref 30–100)

## 2020-06-29 LAB — B12 AND FOLATE PANEL
Folate: 16.2 ng/mL
Vitamin B-12: 413 pg/mL (ref 200–1100)

## 2020-06-29 LAB — FERRITIN: Ferritin: 3 ng/mL — ABNORMAL LOW (ref 16–154)

## 2020-06-29 LAB — COMPLETE METABOLIC PANEL WITH GFR
AG Ratio: 2 (calc) (ref 1.0–2.5)
ALT: 25 U/L (ref 6–29)
AST: 23 U/L (ref 10–30)
Albumin: 4.1 g/dL (ref 3.6–5.1)
Alkaline phosphatase (APISO): 63 U/L (ref 31–125)
BUN: 8 mg/dL (ref 7–25)
CO2: 23 mmol/L (ref 20–32)
Calcium: 9.2 mg/dL (ref 8.6–10.2)
Chloride: 106 mmol/L (ref 98–110)
Creat: 0.8 mg/dL (ref 0.50–1.10)
GFR, Est African American: 110 mL/min/{1.73_m2} (ref >=60)
GFR, Est Non African American: 95 mL/min/{1.73_m2} (ref >=60)
Globulin: 2.1 g/dL (calc) (ref 1.9–3.7)
Glucose, Bld: 90 mg/dL (ref 65–99)
Potassium: 3.6 mmol/L (ref 3.5–5.3)
Sodium: 138 mmol/L (ref 135–146)
Total Bilirubin: 0.9 mg/dL (ref 0.2–1.2)
Total Protein: 6.2 g/dL (ref 6.1–8.1)

## 2020-06-29 LAB — TSH: TSH: 1.38 mIU/L

## 2021-10-03 IMAGING — US US OB COMP LESS 14 WK
1 series · 15 of 17 positions shown · non-contrast
Comparison: None.

CLINICAL DATA: Dating.  Uncertain LMP.

EXAM:
OBSTETRIC <14 WK ULTRASOUND
TECHNIQUE: Transabdominal ultrasound was performed for evaluation of the
gestation as well as the maternal uterus and adnexal regions.

[Series 1: us ob comp less 14 wk · 17 acquisitions, 15 frames shown]
[im 1/17]
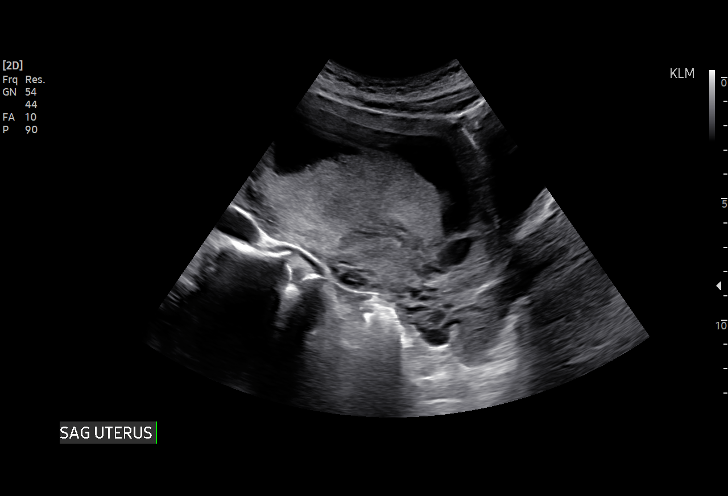
[im 2/17]
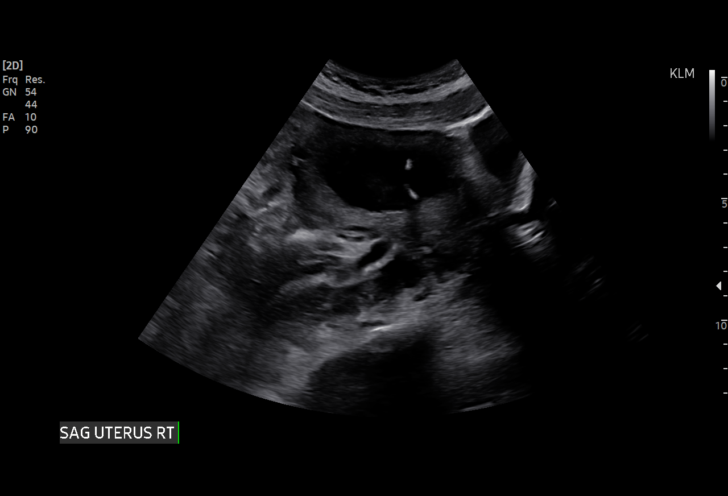
[im 3/17]
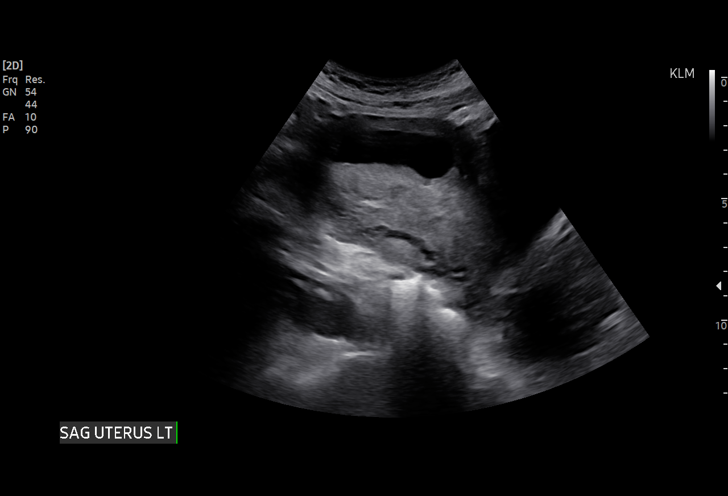
[im 4/17]
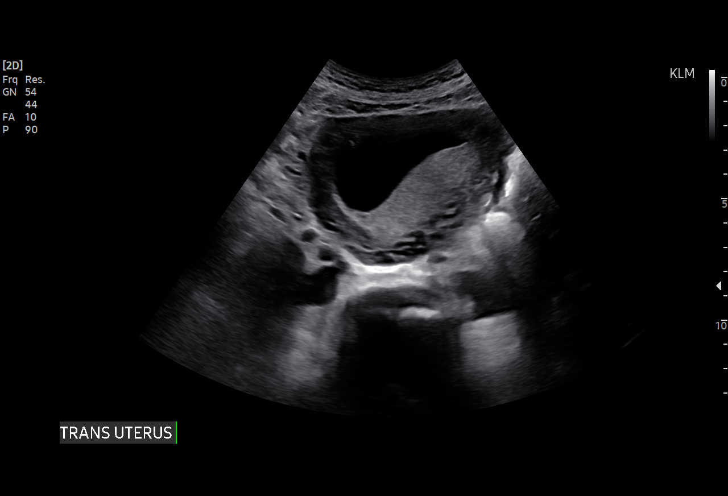
[im 6/17]
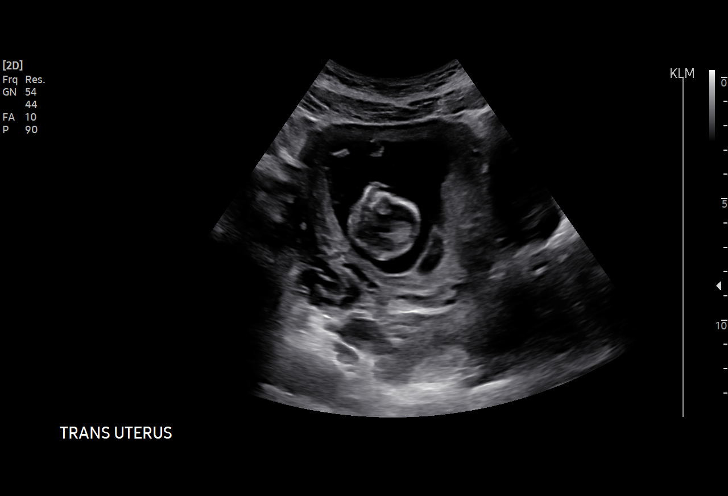
[im 7/17]
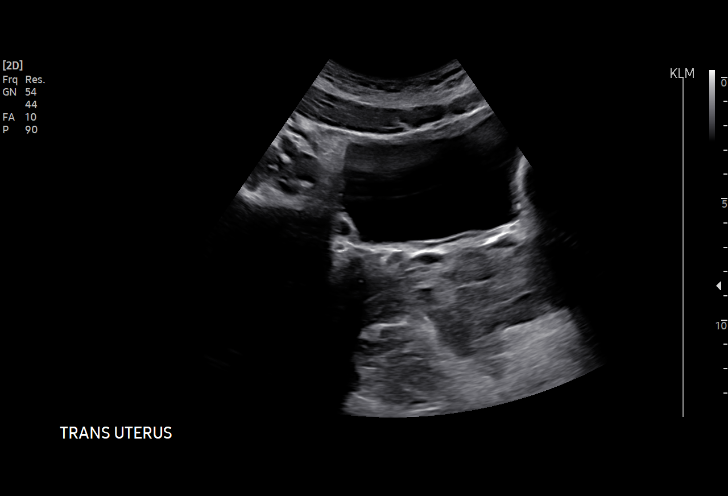
[im 8/17]
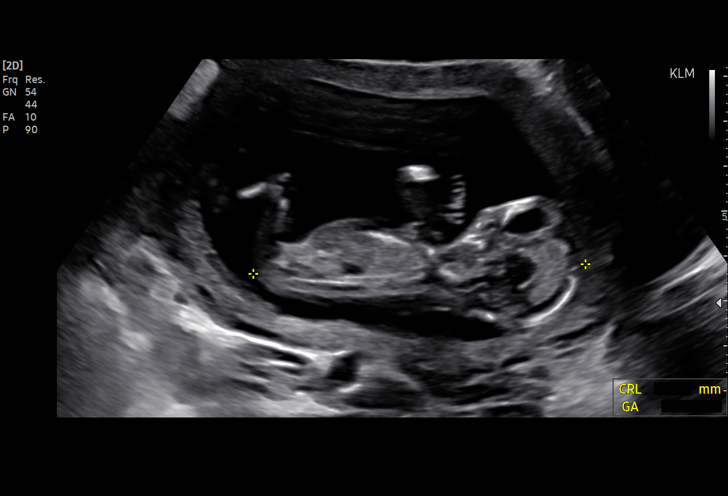
[im 9/17]
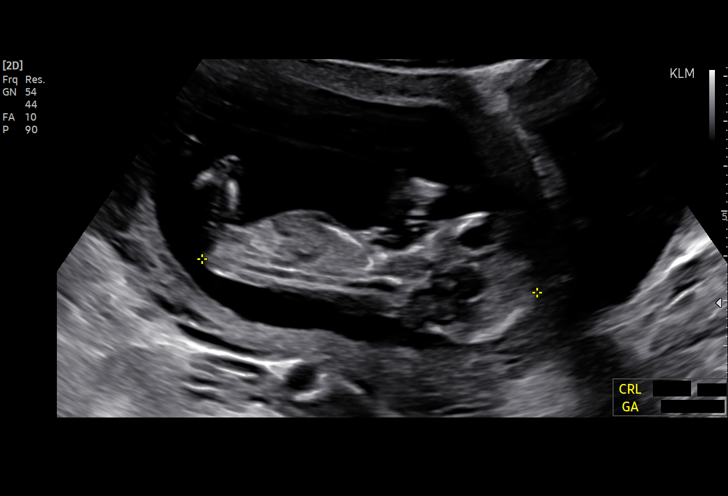
[im 10/17]
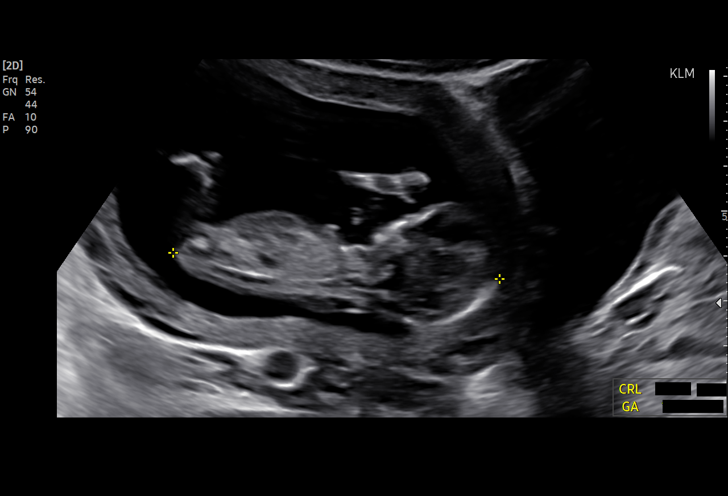
[im 11/17]
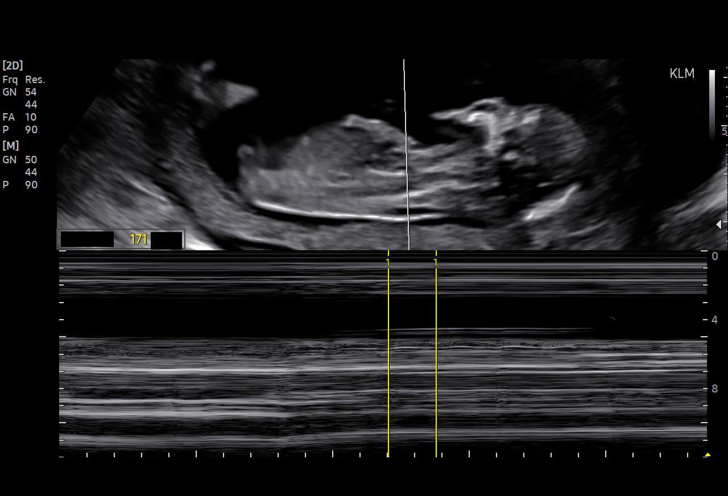
[im 12/17]
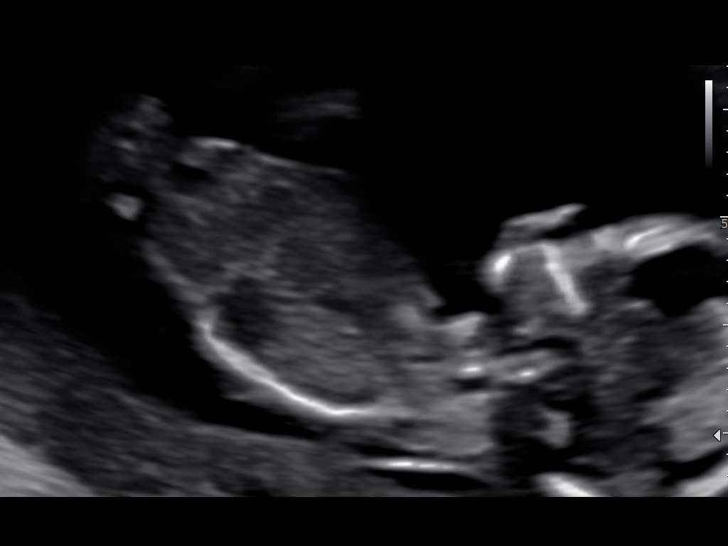
[im 14/17]
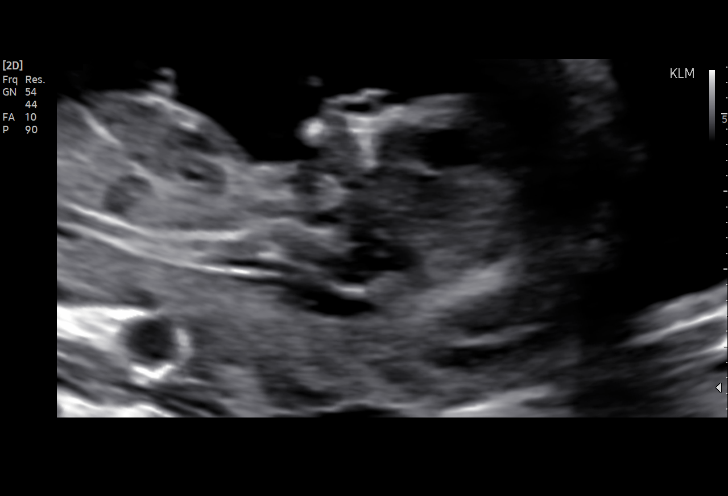
[im 15/17]
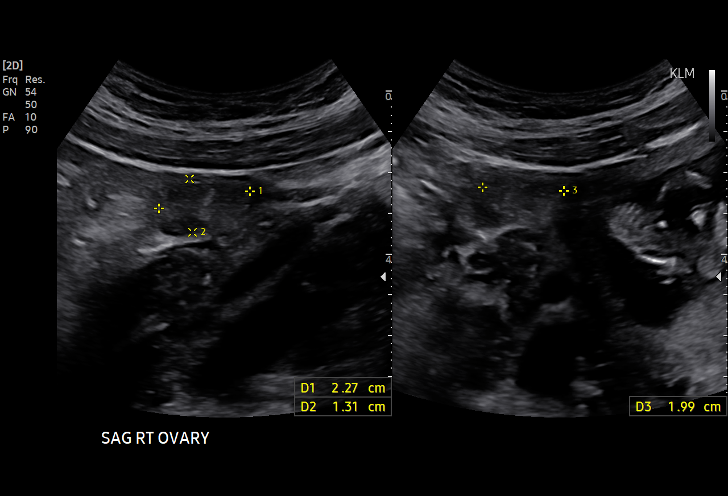
[im 16/17]
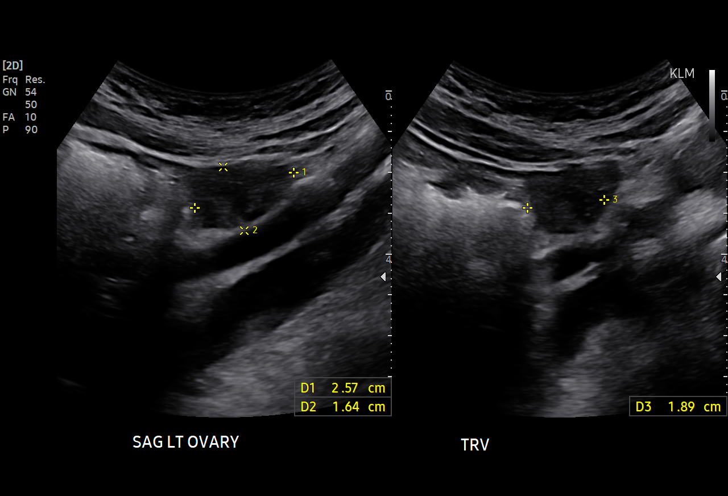
[im 17/17]
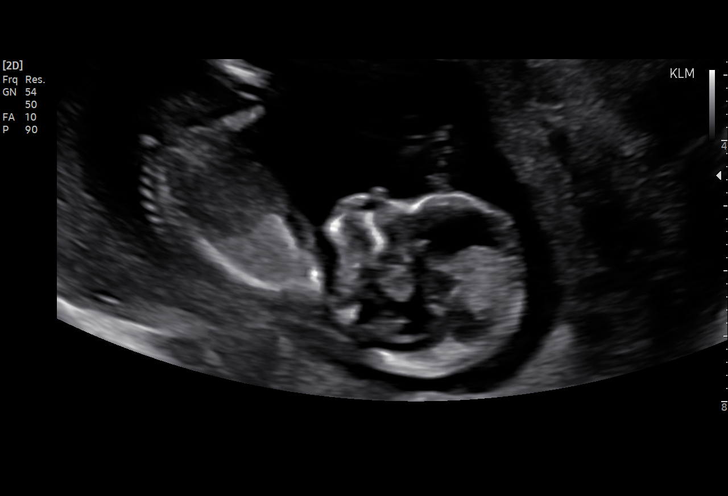

[15 of 17 positions shown; findings below may reference images not displayed]

FINDINGS: Intrauterine gestational sac: Single

Yolk sac:  Not Visualized.

Embryo:  Visualized.

Cardiac Activity: Visualized.

Heart Rate: 171 bpm

CRL: 73.9 mm   13 w 4 d                  US EDC: 04/15/2020

Subchorionic hemorrhage:  None

Maternal uterus/adnexae: Ovaries are normal in appearance. No free
pelvic fluid.
IMPRESSION: Single living intrauterine embryo

Size by ultrasound today correlates with EDC of 04/15/2020.
